# Patient Record
Sex: Male | Born: 1960
Health system: Southern US, Community
[De-identification: ages and names within clinical notes are randomized; demographics above are authoritative.]

## PROBLEM LIST (undated history)

## (undated) DIAGNOSIS — I10 Essential (primary) hypertension: Secondary | ICD-10-CM

## (undated) DIAGNOSIS — I2510 Atherosclerotic heart disease of native coronary artery without angina pectoris: Secondary | ICD-10-CM

## (undated) HISTORY — DX: Atherosclerotic heart disease of native coronary artery without angina pectoris: I25.10

## (undated) HISTORY — PX: CHOLECYSTECTOMY: SHX55

---

## 1998-07-25 ENCOUNTER — Encounter: Payer: Self-pay | Admitting: Endocrinology

## 1998-07-25 ENCOUNTER — Ambulatory Visit (HOSPITAL_COMMUNITY): Admission: RE | Admit: 1998-07-25 | Discharge: 1998-07-25 | Payer: Self-pay | Admitting: Endocrinology

## 2000-08-26 ENCOUNTER — Encounter: Payer: Self-pay | Admitting: Specialist

## 2000-08-26 ENCOUNTER — Ambulatory Visit (HOSPITAL_COMMUNITY): Admission: RE | Admit: 2000-08-26 | Discharge: 2000-08-26 | Payer: Self-pay | Admitting: Specialist

## 2004-04-25 ENCOUNTER — Ambulatory Visit: Payer: Self-pay | Admitting: Orthopedic Surgery

## 2004-05-02 ENCOUNTER — Ambulatory Visit (HOSPITAL_COMMUNITY): Admission: RE | Admit: 2004-05-02 | Discharge: 2004-05-02 | Payer: Self-pay | Admitting: Orthopedic Surgery

## 2004-05-10 ENCOUNTER — Ambulatory Visit: Payer: Self-pay | Admitting: Orthopedic Surgery

## 2007-05-19 ENCOUNTER — Emergency Department (HOSPITAL_COMMUNITY): Admission: EM | Admit: 2007-05-19 | Discharge: 2007-05-19 | Payer: Self-pay | Admitting: Emergency Medicine

## 2007-06-09 ENCOUNTER — Ambulatory Visit: Payer: Self-pay | Admitting: Internal Medicine

## 2007-06-15 ENCOUNTER — Encounter: Admission: RE | Admit: 2007-06-15 | Discharge: 2007-06-15 | Payer: Self-pay | Admitting: Internal Medicine

## 2007-10-16 ENCOUNTER — Encounter: Payer: Self-pay | Admitting: Internal Medicine

## 2008-06-17 ENCOUNTER — Encounter: Admission: RE | Admit: 2008-06-17 | Discharge: 2008-06-17 | Payer: Self-pay | Admitting: Neurosurgery

## 2010-07-03 ENCOUNTER — Ambulatory Visit (HOSPITAL_COMMUNITY)
Admission: RE | Admit: 2010-07-03 | Discharge: 2010-07-03 | Disposition: A | Discharge status: Home / Self Care | Payer: Federal, State, Local not specified - PPO | Source: Ambulatory Visit | Attending: General Surgery | Admitting: General Surgery

## 2010-07-03 DIAGNOSIS — Z1211 Encounter for screening for malignant neoplasm of colon: Secondary | ICD-10-CM | POA: Insufficient documentation

## 2010-07-03 DIAGNOSIS — I10 Essential (primary) hypertension: Secondary | ICD-10-CM | POA: Insufficient documentation

## 2010-07-03 NOTE — H&P (Signed)
  [name], [name] NO.:  [id number]  MEDICAL RECORD NO.:  [id number]            PATIENT TYPE:  LOCATION:                                 FACILITY:  PHYSICIAN:  Dalia Heading, M.D.  DATE OF BIRTH:  [birth year]  DATE OF ADMISSION: DATE OF DISCHARGE:  LH                             HISTORY & PHYSICAL   CHIEF COMPLAINT:  Need for screening colonoscopy.  HISTORY OF PRESENT ILLNESS:  The patient is a 50 year old white male who is referred for endoscopic evaluation.  He needs colonoscopy for screening purposes.  No abdominal pain, weight loss, nausea, vomiting, diarrhea, constipation, melena, hematochezia have been noted.  He has never had a colonoscopy.  PAST MEDICAL HISTORY:  Unremarkable.  PAST SURGICAL HISTORY:  Cholecystectomy.  CURRENT MEDICATIONS:  Fish oil and vitamins.  ALLERGIES:  No known drug allergies.  REVIEW OF SYSTEMS:  The patient denies drinking or smoking.  He denies any other cardiopulmonary difficulties or bleeding disorders.  FAMILY MEDICAL HISTORY:  The patient denies any family history of colon cancer.  On physical examination, the patient is a well-developed, well-nourished white male in no acute distress.  Lungs, clear to auscultation with equal breath sounds bilaterally.  Heart examination reveals a regular rate and rhythm without S3, S4, murmurs.  The abdomen is soft, nontender, nondistended.  No hepatosplenomegaly or masses are noted. Rectal examination was deferred during the procedure.  IMPRESSION:  Need for screening colonoscopy.  PLAN:  The patient is scheduled for a colonoscopy on July 03, 2010. The risks and benefits of the procedure including bleeding and perforation were fully explained to the patient, gave informed consent.     Dalia Heading, M.D.     MAJ/MEDQ  D:  06/26/2010  T:  06/27/2010  Job:  [id number]  cc:   Corrie Mckusick, M.D. Fax: [phone]  Short Stay at [facility]  Electronically Signed  by Franky Macho M.D. on 07/03/2010 08:49:45 AM

## 2010-07-05 NOTE — Op Note (Signed)
  NAMEJAMAIR, CATO NO.:  [id number]  MEDICAL RECORD NO.:  [id number]           PATIENT TYPE:  O  LOCATION:  [facility]                          FACILITY:  [facility]  PHYSICIAN:  Dalia Heading, M.D.  DATE OF BIRTH:  02-Mar-1961  DATE OF PROCEDURE:  07/03/2010 DATE OF DISCHARGE:                              OPERATIVE REPORT   PREOPERATIVE DIAGNOSIS:  Need for screening colonoscopy.  POSTOPERATIVE DIAGNOSIS:  Need for screening colonoscopy.  PROCEDURE:  Colonoscopy.  SURGEON:  Dalia Heading, MD  ANESTHESIA:  Demerol 50 mg IV, Versed 3 mg IV.  INDICATIONS:  The patient is a 50 year old white male who presents for a screening colonoscopy.  The risks and benefits of the procedure including bleeding and perforation were fully explained to the patient, gave informed consent.  PROCEDURE NOTE:  The patient was placed in left lateral decubitus position after placement of monitored anesthesia equipment.  Demerol and Versed were used throughout the procedure for anesthesia.  A rectal examination was performed which was negative.  The colonoscope was advanced to the cecum without difficulty. Confirmation of placement to the cecum was done using transabdominal palpation and landmarks.  The bowel preparation was excellent.  The cecum was fully visualized and noted to be within normal limits.  The ascending colon, transverse colon, descending colon, sigmoid colon, and rectum were all within normal limits.  No abnormal lesions were noted. The dentate line was inspected and noted to be within normal limits. All air was evacuated from the colon and rectum prior to removal of the colonoscope.  The patient tolerated the procedure well, was transferred back to Day Surgery in stable condition.  Complications none.  SPECIMEN:  None.  RECOMMENDATIONS:  A followup screening colonoscopy is recommended in 10 years.     Dalia Heading, M.D.     MAJ/MEDQ  D:  07/03/2010   T:  07/04/2010  Job:  [id number]  cc:   Corrie Mckusick, M.D. Fax: [phone]  Electronically Signed by Franky Macho M.D. on 07/05/2010 10:59:05 AM

## 2010-09-04 NOTE — Assessment & Plan Note (Signed)
[facility]                         GASTROENTEROLOGY OFFICE NOTE   NAME:[name], [name]                      MRN:          [id number]  DATE:06/09/2007                            DOB:          Oct 28, 1960    CHIEF COMPLAINT:  Abdominal pain.   HISTORY:  This is a 50 year old white man that has had intermittent  right upper quadrant pain and often a pressure symptom.  He will have  sort of a chronic pressure feeling there as well.  He had a severe spell  recently and he went to the [facility] ER.  It occurred after eating  pizza.  Normal LFTs while there.  He was told to see his primary care  physician and have an ultrasound.  He elected to come to me instead.  He  had a normal CBC as well.  This was on January 27th.  He does have what  he describes as some indigestion, not classic heartburn, but there are  some sporadic epigastric and right upper quadrant episodes of burning.  He has not really taken antacids or proton pump inhibitors at all.  Most  of the time he feels okay, though he frequently has this vague pressure  feeling in the right upper quadrant.  As far as caffeine use, he drinks  a lot of iced tea.  He is not a smoker.  He does not describe dysphagia,  and the remainder of his GI review of systems is negative.   CURRENT MEDICATIONS:  1. Effexor XR 150 mg daily.  2. Aspirin 81 mg daily.  3. Multivitamin daily.   PAST MEDICAL HISTORY:  1. History of seizure disorder.  He used to take dilantin, no longer.      He had heartburn and indigestion at that time and did use antacids.  2. Dyslipidemia.  3. History of depression.  4. Hypertension.   FAMILY HISTORY:  Father had heart disease, diabetes in a grandmother.   SOCIAL HISTORY:  He is separated.  He works for the post [employer].  One  son, one daughter.  He lives alone.  No alcohol, tobacco, or drugs.  He  has had some decrease in vision lately.  All other systems negative.   PHYSICAL  EXAMINATION:  A well-developed obese white man.  Height 5 feet 10 inches, weight 224.4 pounds, blood pressure is 128/86,  pulse 88.  EYES:  Anicteric.  ENT:  Normal mouth, posterior pharynx.  NECK:  Supple, no thyromegaly or mass.  CHEST:  Clear.  HEART:  S1, S2, no murmurs, rubs, or gallops.  ABDOMEN:  Shows a large diastasis recti, but is soft otherwise.  No  organomegaly or mass, nontender.  LYMPHATIC:  No neck or supraclavicular nodes.  EXTREMITIES:  No edema.  SKIN:  No rash.  PSYCH:  He is alert and oriented x3.  Appropriate affect.  NEURO:  Cranial nerves II-XII are intact.   ASSESSMENT:  Intermittent right upper quadrant pain and indigestion or  dyspepsia symptoms.  The differential seems to be possible biliary colic  versus gastroesophageal reflux disease.  Those are the 2 most likely  things.   PLAN:  1. Empiric trial of Prilosec OTC on a daily basis.  2. Abdominal ultrasound will be scheduled to see if he has gallstones.      If he does have gallstones, then a surgical evaluation would be      appropriate.  I told him that it would not necessarily prove that      his symptoms came from gallstones, however.  3. Since he does seem to have had some type of indigestion or      gastroesophageal reflux disease symptoms for about 5 years, an      upper endoscopy certainly could prove useful.  If he responds to      the Prilosec OTC, he should continue that, but I would still      recommend an upper gastrointestinal endoscopy to look for possible      esophageal damage, etc.     Iva Boop, MD,FACG  Electronically Signed    CEG/MedQ  DD: 06/09/2007  DT: 06/10/2007  Job #: [id number]   cc:   Angus G. Renard Matter, MD

## 2011-01-10 LAB — CBC
HCT: 43.2
Hemoglobin: 14.8
MCHC: 34.1
MCV: 84.4
Platelets: 353
RBC: 5.12
RDW: 13.8
WBC: 9.5

## 2011-01-10 LAB — COMPREHENSIVE METABOLIC PANEL
AST: 25
Albumin: 3.8
BUN: 12
CO2: 26
Calcium: 9
Chloride: 102
Creatinine, Ser: 0.87
GFR calc Af Amer: >60
GFR calc non Af Amer: >60
Total Bilirubin: 0.7

## 2011-01-10 LAB — LIPASE, BLOOD: Lipase: 26

## 2011-01-10 LAB — DIFFERENTIAL
Basophils Absolute: 0
Lymphocytes Relative: 13
Lymphs Abs: 1.2
Neutro Abs: 7.3

## 2016-01-25 DIAGNOSIS — R0989 Other specified symptoms and signs involving the circulatory and respiratory systems: Secondary | ICD-10-CM | POA: Diagnosis not present

## 2016-01-25 DIAGNOSIS — R0789 Other chest pain: Secondary | ICD-10-CM | POA: Diagnosis not present

## 2016-02-01 DIAGNOSIS — I10 Essential (primary) hypertension: Secondary | ICD-10-CM | POA: Diagnosis not present

## 2016-02-01 DIAGNOSIS — I6523 Occlusion and stenosis of bilateral carotid arteries: Secondary | ICD-10-CM | POA: Diagnosis not present

## 2016-02-01 DIAGNOSIS — R0789 Other chest pain: Secondary | ICD-10-CM | POA: Diagnosis not present

## 2016-03-20 DIAGNOSIS — R600 Localized edema: Secondary | ICD-10-CM | POA: Diagnosis not present

## 2016-03-20 DIAGNOSIS — J0141 Acute recurrent pansinusitis: Secondary | ICD-10-CM | POA: Diagnosis not present

## 2016-03-27 DIAGNOSIS — I10 Essential (primary) hypertension: Secondary | ICD-10-CM | POA: Diagnosis not present

## 2016-03-27 DIAGNOSIS — R635 Abnormal weight gain: Secondary | ICD-10-CM | POA: Diagnosis not present

## 2016-03-27 DIAGNOSIS — R252 Cramp and spasm: Secondary | ICD-10-CM | POA: Diagnosis not present

## 2016-06-21 DIAGNOSIS — E782 Mixed hyperlipidemia: Secondary | ICD-10-CM | POA: Diagnosis not present

## 2016-06-21 DIAGNOSIS — E6609 Other obesity due to excess calories: Secondary | ICD-10-CM | POA: Diagnosis not present

## 2016-06-21 DIAGNOSIS — I10 Essential (primary) hypertension: Secondary | ICD-10-CM | POA: Diagnosis not present

## 2016-06-21 DIAGNOSIS — Z1389 Encounter for screening for other disorder: Secondary | ICD-10-CM | POA: Diagnosis not present

## 2016-06-21 DIAGNOSIS — Z6836 Body mass index (BMI) 36.0-36.9, adult: Secondary | ICD-10-CM | POA: Diagnosis not present

## 2016-06-26 DIAGNOSIS — I10 Essential (primary) hypertension: Secondary | ICD-10-CM | POA: Diagnosis not present

## 2017-01-22 DIAGNOSIS — Z23 Encounter for immunization: Secondary | ICD-10-CM | POA: Diagnosis not present

## 2017-01-22 DIAGNOSIS — Z79899 Other long term (current) drug therapy: Secondary | ICD-10-CM | POA: Diagnosis not present

## 2017-01-22 DIAGNOSIS — I10 Essential (primary) hypertension: Secondary | ICD-10-CM | POA: Diagnosis not present

## 2017-01-22 DIAGNOSIS — R9431 Abnormal electrocardiogram [ECG] [EKG]: Secondary | ICD-10-CM | POA: Diagnosis not present

## 2017-01-22 DIAGNOSIS — Z125 Encounter for screening for malignant neoplasm of prostate: Secondary | ICD-10-CM | POA: Diagnosis not present

## 2017-04-10 DIAGNOSIS — M545 Low back pain: Secondary | ICD-10-CM | POA: Diagnosis not present

## 2017-04-10 DIAGNOSIS — M5410 Radiculopathy, site unspecified: Secondary | ICD-10-CM | POA: Diagnosis not present

## 2017-04-10 DIAGNOSIS — E6609 Other obesity due to excess calories: Secondary | ICD-10-CM | POA: Diagnosis not present

## 2017-04-10 DIAGNOSIS — Z1389 Encounter for screening for other disorder: Secondary | ICD-10-CM | POA: Diagnosis not present

## 2017-04-10 DIAGNOSIS — Z6835 Body mass index (BMI) 35.0-35.9, adult: Secondary | ICD-10-CM | POA: Diagnosis not present

## 2017-04-17 DIAGNOSIS — M7751 Other enthesopathy of right foot: Secondary | ICD-10-CM | POA: Diagnosis not present

## 2017-04-17 DIAGNOSIS — M7752 Other enthesopathy of left foot: Secondary | ICD-10-CM | POA: Diagnosis not present

## 2017-04-17 DIAGNOSIS — G5761 Lesion of plantar nerve, right lower limb: Secondary | ICD-10-CM | POA: Diagnosis not present

## 2017-04-17 DIAGNOSIS — G5762 Lesion of plantar nerve, left lower limb: Secondary | ICD-10-CM | POA: Diagnosis not present

## 2017-04-24 DIAGNOSIS — M7751 Other enthesopathy of right foot: Secondary | ICD-10-CM | POA: Diagnosis not present

## 2017-04-24 DIAGNOSIS — G5760 Lesion of plantar nerve, unspecified lower limb: Secondary | ICD-10-CM | POA: Diagnosis not present

## 2017-04-24 DIAGNOSIS — M7752 Other enthesopathy of left foot: Secondary | ICD-10-CM | POA: Diagnosis not present

## 2017-06-23 DIAGNOSIS — E6609 Other obesity due to excess calories: Secondary | ICD-10-CM | POA: Diagnosis not present

## 2017-06-23 DIAGNOSIS — R42 Dizziness and giddiness: Secondary | ICD-10-CM | POA: Diagnosis not present

## 2017-06-23 DIAGNOSIS — Z6835 Body mass index (BMI) 35.0-35.9, adult: Secondary | ICD-10-CM | POA: Diagnosis not present

## 2017-06-23 DIAGNOSIS — Z1389 Encounter for screening for other disorder: Secondary | ICD-10-CM | POA: Diagnosis not present

## 2017-09-02 DIAGNOSIS — G5762 Lesion of plantar nerve, left lower limb: Secondary | ICD-10-CM | POA: Diagnosis not present

## 2017-09-02 DIAGNOSIS — G5761 Lesion of plantar nerve, right lower limb: Secondary | ICD-10-CM | POA: Diagnosis not present

## 2017-10-17 DIAGNOSIS — R5383 Other fatigue: Secondary | ICD-10-CM | POA: Diagnosis not present

## 2017-10-17 DIAGNOSIS — F320 Major depressive disorder, single episode, mild: Secondary | ICD-10-CM | POA: Diagnosis not present

## 2017-10-17 DIAGNOSIS — Z6835 Body mass index (BMI) 35.0-35.9, adult: Secondary | ICD-10-CM | POA: Diagnosis not present

## 2017-10-17 DIAGNOSIS — Z1389 Encounter for screening for other disorder: Secondary | ICD-10-CM | POA: Diagnosis not present

## 2017-12-31 ENCOUNTER — Emergency Department (HOSPITAL_COMMUNITY)
Admission: EM | Admit: 2017-12-31 | Discharge: 2017-12-31 | Disposition: A | Discharge status: Home / Self Care | Payer: Federal, State, Local not specified - PPO | Attending: Emergency Medicine | Admitting: Emergency Medicine

## 2017-12-31 ENCOUNTER — Encounter (HOSPITAL_COMMUNITY): Payer: Self-pay | Admitting: Emergency Medicine

## 2017-12-31 ENCOUNTER — Other Ambulatory Visit: Payer: Self-pay

## 2017-12-31 DIAGNOSIS — R197 Diarrhea, unspecified: Secondary | ICD-10-CM | POA: Diagnosis not present

## 2017-12-31 DIAGNOSIS — I10 Essential (primary) hypertension: Secondary | ICD-10-CM | POA: Insufficient documentation

## 2017-12-31 DIAGNOSIS — Z79899 Other long term (current) drug therapy: Secondary | ICD-10-CM | POA: Diagnosis not present

## 2017-12-31 HISTORY — DX: Essential (primary) hypertension: I10

## 2017-12-31 LAB — URINALYSIS, ROUTINE W REFLEX MICROSCOPIC
Bacteria, UA: NONE SEEN
Bilirubin Urine: NEGATIVE
GLUCOSE, UA: NEGATIVE mg/dL
Ketones, ur: 5 mg/dL — AB
Leukocytes, UA: NEGATIVE
NITRITE: NEGATIVE
PH: 5 (ref 5.0–8.0)
Protein, ur: 30 mg/dL — AB
SPECIFIC GRAVITY, URINE: 1.016 (ref 1.005–1.030)

## 2017-12-31 LAB — BASIC METABOLIC PANEL
Anion gap: 9 (ref 5–15)
BUN: 8 mg/dL (ref 6–20)
CHLORIDE: 100 mmol/L (ref 98–111)
CO2: 26 mmol/L (ref 22–32)
Calcium: 8.6 mg/dL — ABNORMAL LOW (ref 8.9–10.3)
Creatinine, Ser: 1.35 mg/dL — ABNORMAL HIGH (ref 0.61–1.24)
GFR calc Af Amer: >60 mL/min (ref >60)
GFR calc non Af Amer: 57 mL/min — ABNORMAL LOW (ref >60)
GLUCOSE: 102 mg/dL — AB (ref 70–99)
POTASSIUM: 3.1 mmol/L — AB (ref 3.5–5.1)
Sodium: 135 mmol/L (ref 135–145)

## 2017-12-31 LAB — CBC WITH DIFFERENTIAL/PLATELET
Basophils Absolute: 0 10*3/uL (ref 0.0–0.1)
Basophils Relative: 0 %
Eosinophils Absolute: 0 10*3/uL (ref 0.0–0.7)
Eosinophils Relative: 0 %
HEMATOCRIT: 47.2 % (ref 39.0–52.0)
Hemoglobin: 16.4 g/dL (ref 13.0–17.0)
Lymphocytes Relative: 12 %
Lymphs Abs: 1.7 10*3/uL (ref 0.7–4.0)
MCH: 29.2 pg (ref 26.0–34.0)
MCHC: 34.7 g/dL (ref 30.0–36.0)
MCV: 84 fL (ref 78.0–100.0)
MONO ABS: 1.2 10*3/uL — AB (ref 0.1–1.0)
Monocytes Relative: 8 %
NEUTROS ABS: 11.4 10*3/uL — AB (ref 1.7–7.7)
Neutrophils Relative %: 80 %
Platelets: 222 10*3/uL (ref 150–400)
RBC: 5.62 MIL/uL (ref 4.22–5.81)
RDW: 14 % (ref 11.5–15.5)
WBC: 14.2 10*3/uL — ABNORMAL HIGH (ref 4.0–10.5)

## 2017-12-31 NOTE — ED Provider Notes (Signed)
[facility] EMERGENCY DEPARTMENT Provider Note   CSN: [id number] Arrival date & time: 12/31/17  1535     History   Chief Complaint Chief Complaint  Patient presents with  . Abdominal Pain    HPI [name] is a 57 y.o. male.  Patient reports diarrhea for the past 24 hours with some abdominal bloating.  He thinks he was exposed to a virus.  His secondary concern is exposure to a dead skunk and the possibility of rabies exposure.  He picked up the skunk with a pair of gloves and deposited in a plastic bag.  At no time did his skin touch the skunk.  Review of systems positive for low-grade fever and chills.  Severity of symptoms is mild.  Nothing makes symptoms better or worse.     Past Medical History:  Diagnosis Date  . Hypertension     There are no active problems to display for this patient.   Past Surgical History:  Procedure Laterality Date  . CHOLECYSTECTOMY          Home Medications    Prior to Admission medications   Medication Sig Start Date End Date Taking? Authorizing Provider  buPROPion (WELLBUTRIN XL) 150 MG 24 hr tablet Take 150 mg by mouth daily. 10/17/17  Yes [provider]  CARTIA XT 180 MG 24 hr capsule Take 180 mg by mouth daily. 12/13/17  Yes [provider]  ibuprofen (ADVIL,MOTRIN) 200 MG tablet Take 400-600 mg by mouth daily as needed for mild pain or moderate pain.   Yes [provider]  losartan-hydrochlorothiazide (HYZAAR) 100-12.5 MG tablet Take 1 tablet by mouth daily.   Yes [provider]    Family History History reviewed. No pertinent family history.  Social History Social History   Tobacco Use  . Smoking status: Never Smoker  . Smokeless tobacco: Never Used  Substance Use Topics  . Alcohol use: Never    Frequency: Never  . Drug use: Never     Allergies   Patient has no known allergies.   Review of Systems Review of Systems  All other systems reviewed and are  negative.    Physical Exam Updated Vital Signs BP 131/83 (BP Location: Right Arm)   Pulse 92   Temp 99.5 F (37.5 C) (Oral)   Resp 18   Ht 5\' 10"  (1.778 m)   Wt 111.1 kg   SpO2 96%   BMI 35.15 kg/m   Physical Exam  Constitutional: He is oriented to person, place, and time. He appears well-developed and well-nourished.  HENT:  Head: Normocephalic and atraumatic.  Eyes: Conjunctivae are normal.  Neck: Neck supple.  Cardiovascular: Normal rate and regular rhythm.  Pulmonary/Chest: Effort normal and breath sounds normal.  Abdominal: Soft. Bowel sounds are normal.  Musculoskeletal: Normal range of motion.  Neurological: He is alert and oriented to person, place, and time.  Skin: Skin is warm and dry.  Psychiatric: He has a normal mood and affect. His behavior is normal.  Nursing note and vitals reviewed.    ED Treatments / Results  Labs (all labs ordered are listed, but only abnormal results are displayed) Labs Reviewed  URINALYSIS, ROUTINE W REFLEX MICROSCOPIC - Abnormal; Notable for the following components:      Result Value   Hgb urine dipstick SMALL (*)    Ketones, ur 5 (*)    Protein, ur 30 (*)    All other components within normal limits  CBC WITH DIFFERENTIAL/PLATELET - Abnormal; Notable  for the following components:   WBC 14.2 (*)    Neutro Abs 11.4 (*)    Monocytes Absolute 1.2 (*)    All other components within normal limits  BASIC METABOLIC PANEL    EKG None  Radiology No results found.  Procedures Procedures (including critical care time)  Medications Ordered in ED Medications - No data to display   Initial Impression / Assessment and Plan / ED Course  I have reviewed the triage vital signs and the nursing notes.  Pertinent labs & imaging results that were available during my care of the patient were reviewed by me and considered in my medical decision making (see chart for details).     Patient presents with diarrhea and concern over a  skunk exposure.  Ketones were noted in the urine.  My opinion is that he is low risk for rabies.  He was satisfied with that answer.  He did not want any further testing or IV fluids today.  Final Clinical Impressions(s) / ED Diagnoses   Final diagnoses:  Diarrhea, unspecified type    ED Discharge Orders    None       Donnetta Hutching, MD 12/31/17 2002

## 2017-12-31 NOTE — ED Triage Notes (Signed)
Pt reports abd pain, diarrhea x5,chills since last night.  Pt reports had dead skunk in yard. Pt denies any contact but reports want to make sure "wasn't exposed to rabies."

## 2017-12-31 NOTE — Discharge Instructions (Addendum)
Increase fluids.  You are ultra low risk for rabies exposure.

## 2018-01-02 ENCOUNTER — Other Ambulatory Visit: Payer: Self-pay

## 2018-01-02 ENCOUNTER — Encounter (HOSPITAL_COMMUNITY): Payer: Self-pay | Admitting: *Deleted

## 2018-01-02 ENCOUNTER — Emergency Department (HOSPITAL_COMMUNITY)
Admission: EM | Admit: 2018-01-02 | Discharge: 2018-01-02 | Disposition: A | Discharge status: Home / Self Care | Payer: Federal, State, Local not specified - PPO | Attending: Emergency Medicine | Admitting: Emergency Medicine

## 2018-01-02 DIAGNOSIS — R197 Diarrhea, unspecified: Secondary | ICD-10-CM | POA: Insufficient documentation

## 2018-01-02 DIAGNOSIS — I10 Essential (primary) hypertension: Secondary | ICD-10-CM | POA: Insufficient documentation

## 2018-01-02 DIAGNOSIS — Z79899 Other long term (current) drug therapy: Secondary | ICD-10-CM | POA: Insufficient documentation

## 2018-01-02 MED ORDER — LOPERAMIDE HCL 2 MG PO CAPS: 2.0000 mg | ORAL_CAPSULE | Freq: Four times a day (QID) | ORAL | 0 refills | Status: DC | PRN

## 2018-01-02 NOTE — Discharge Instructions (Signed)
The symptoms of rabies do not show up until someone has had the bite for over 2-8 weeks - As there was no bite and this started less than a week after the skunk exposure - it does not fit a pattern for rabies.  You may take imodium for diarrhea as needed  See your doctor in 2 days for recheck

## 2018-01-02 NOTE — ED Provider Notes (Signed)
[facility] EMERGENCY DEPARTMENT Provider Note   CSN: [id number] Arrival date & time: 01/02/18  2028     History   Chief Complaint Chief Complaint  Patient presents with  . Follow-up    HPI Gene Kansashomas D Ross is a 57 y.o. male.  HPI  The patient is a 57 year old male, history of high blood pressure, otherwise healthy individual, presents concerned that he may have rabies.  The patient states that he handled a dead skunk approximately 11 days ago, shortly after handling the skunk (with gloves, only touching the tail) he started to have some abdominal discomfort, some watery diarrhea, some temperatures between 99 and 100 degrees and now has a scratchy throat.  He is concerned that this may be related to a rabies exposure.  Again the patient never had any exposure to saliva or any other injury to his skin.  And again his symptoms started less than 1 week after he handled the skunk.  No other travel, no recent exposures to other sick people or food exposures.  Past Medical History:  Diagnosis Date  . Hypertension     There are no active problems to display for this patient.   Past Surgical History:  Procedure Laterality Date  . CHOLECYSTECTOMY          Home Medications    Prior to Admission medications   Medication Sig Start Date End Date Taking? Authorizing Provider  buPROPion (WELLBUTRIN XL) 150 MG 24 hr tablet Take 150 mg by mouth daily. 10/17/17   [provider]  CARTIA XT 180 MG 24 hr capsule Take 180 mg by mouth daily. 12/13/17   [provider]  ibuprofen (ADVIL,MOTRIN) 200 MG tablet Take 400-600 mg by mouth daily as needed for mild pain or moderate pain.    [provider]  loperamide (IMODIUM) 2 MG capsule Take 1 capsule (2 mg total) by mouth 4 (four) times daily as needed for diarrhea or loose stools. 01/02/18   Eber HongMiller, Sandria Mcenroe, MD  losartan-hydrochlorothiazide (HYZAAR) 100-12.5 MG tablet Take 1 tablet by mouth daily.    [provider]    Family History No family history on file.  Social History Social History   Tobacco Use  . Smoking status: Never Smoker  . Smokeless tobacco: Never Used  Substance Use Topics  . Alcohol use: Never    Frequency: Never  . Drug use: Never     Allergies   Patient has no known allergies.   Review of Systems Review of Systems  All other systems reviewed and are negative.    Physical Exam Updated Vital Signs BP (!) 149/90 (BP Location: Right Arm)   Pulse 92   Temp 98.8 F (37.1 C) (Oral)   Resp 16   Ht 1.778 m (5\' 10" )   Wt 107 kg   SpO2 95%   BMI 33.83 kg/m   Physical Exam  Constitutional: He appears well-developed and well-nourished. No distress.  HENT:  Head: Normocephalic and atraumatic.  Mouth/Throat: Oropharynx is clear and moist. No oropharyngeal exudate.  Mildly erythematous posterior pharynx without exudate asymmetry or hypertrophy, uvula midline, phonation normal  Eyes: Pupils are equal, round, and reactive to light. Conjunctivae and EOM are normal. Right eye exhibits no discharge. Left eye exhibits no discharge. No scleral icterus.  Neck: Normal range of motion. Neck supple. No JVD present. No thyromegaly present.  No lymphadenopathy of the neck  Cardiovascular: Normal rate, regular rhythm, normal heart sounds and intact distal pulses. Exam reveals no gallop and  no friction rub.  No murmur heard. Pulmonary/Chest: Effort normal and breath sounds normal. No respiratory distress. He has no wheezes. He has no rales.  Abdominal: Soft. Bowel sounds are normal. He exhibits no distension and no mass. There is no tenderness.  Soft and totally nontender abdomen  Musculoskeletal: Normal range of motion. He exhibits no edema or tenderness.  Lymphadenopathy:    He has no cervical adenopathy.  Neurological: He is alert. Coordination normal.  Skin: Skin is warm and dry. No rash noted. No erythema.  No rashes or injuries were puncture wounds  Psychiatric: He has  a normal mood and affect. His behavior is normal.  Nursing note and vitals reviewed.    ED Treatments / Results  Labs (all labs ordered are listed, but only abnormal results are displayed) Labs Reviewed - No data to display  EKG None  Radiology No results found.  Procedures Procedures (including critical care time)  Medications Ordered in ED Medications - No data to display   Initial Impression / Assessment and Plan / ED Course  I have reviewed the triage vital signs and the nursing notes.  Pertinent labs & imaging results that were available during my care of the patient were reviewed by me and considered in my medical decision making (see chart for details).    The patient is clearly nervous about this exposure thinking that all of the symptoms come together to make a diagnosis of rabies however his symptoms started less than 1 week after the exposure, there is no exposure to saliva, he was wearing fully gloved hands when he touch this cocktail, the incubation period is longer than 2 weeks and up to 8 weeks and most exposures making this and very very unlikely diagnosis and I would not recommend rabies vaccination at this time, I agree with Dr. Adriana Simas from his prior visit and will forego treatment at this time.  The patient was given reassurance and encouraged to use Imodium as needed  .  He expressed his understanding.  Final Clinical Impressions(s) / ED Diagnoses   Final diagnoses:  Diarrhea, unspecified type    ED Discharge Orders         Ordered    loperamide (IMODIUM) 2 MG capsule  4 times daily PRN     01/02/18 2054           Eber Hong, MD 01/02/18 [phone]

## 2018-01-02 NOTE — ED Triage Notes (Signed)
Pt states that two weeks ago he found a dead skunk in his backyard, pt denies any scratches, bites by skunk, states that he handled the dead skunk with hands that was gloved, pt started having abd pain, fever, diarrhea, fever of 99-100 a week ago, was seen in er two days ago for same symptoms, pt states that he still believes that he may have been exposed to rabies.

## 2018-01-13 DIAGNOSIS — Z1389 Encounter for screening for other disorder: Secondary | ICD-10-CM | POA: Diagnosis not present

## 2018-01-13 DIAGNOSIS — E6609 Other obesity due to excess calories: Secondary | ICD-10-CM | POA: Diagnosis not present

## 2018-01-13 DIAGNOSIS — J069 Acute upper respiratory infection, unspecified: Secondary | ICD-10-CM | POA: Diagnosis not present

## 2018-01-13 DIAGNOSIS — Z6834 Body mass index (BMI) 34.0-34.9, adult: Secondary | ICD-10-CM | POA: Diagnosis not present

## 2018-07-22 DIAGNOSIS — J069 Acute upper respiratory infection, unspecified: Secondary | ICD-10-CM | POA: Diagnosis not present

## 2018-07-22 DIAGNOSIS — Z6835 Body mass index (BMI) 35.0-35.9, adult: Secondary | ICD-10-CM | POA: Diagnosis not present

## 2018-07-22 DIAGNOSIS — Z1389 Encounter for screening for other disorder: Secondary | ICD-10-CM | POA: Diagnosis not present

## 2018-07-22 DIAGNOSIS — E6609 Other obesity due to excess calories: Secondary | ICD-10-CM | POA: Diagnosis not present

## 2018-12-31 DIAGNOSIS — G4730 Sleep apnea, unspecified: Secondary | ICD-10-CM | POA: Diagnosis not present

## 2018-12-31 DIAGNOSIS — E7849 Other hyperlipidemia: Secondary | ICD-10-CM | POA: Diagnosis not present

## 2018-12-31 DIAGNOSIS — I10 Essential (primary) hypertension: Secondary | ICD-10-CM | POA: Diagnosis not present

## 2018-12-31 DIAGNOSIS — Z23 Encounter for immunization: Secondary | ICD-10-CM | POA: Diagnosis not present

## 2018-12-31 DIAGNOSIS — Z6834 Body mass index (BMI) 34.0-34.9, adult: Secondary | ICD-10-CM | POA: Diagnosis not present

## 2019-03-09 DIAGNOSIS — Z20828 Contact with and (suspected) exposure to other viral communicable diseases: Secondary | ICD-10-CM | POA: Diagnosis not present

## 2019-03-09 DIAGNOSIS — R0789 Other chest pain: Secondary | ICD-10-CM | POA: Diagnosis not present

## 2019-03-09 DIAGNOSIS — Z23 Encounter for immunization: Secondary | ICD-10-CM | POA: Diagnosis not present

## 2019-03-09 DIAGNOSIS — E6609 Other obesity due to excess calories: Secondary | ICD-10-CM | POA: Diagnosis not present

## 2019-03-09 DIAGNOSIS — Z6833 Body mass index (BMI) 33.0-33.9, adult: Secondary | ICD-10-CM | POA: Diagnosis not present

## 2019-03-10 ENCOUNTER — Other Ambulatory Visit: Payer: Self-pay

## 2019-03-10 DIAGNOSIS — Z20822 Contact with and (suspected) exposure to covid-19: Secondary | ICD-10-CM

## 2019-03-12 ENCOUNTER — Telehealth: Payer: Self-pay | Admitting: *Deleted

## 2019-03-12 LAB — NOVEL CORONAVIRUS, NAA: SARS-CoV-2, NAA: NOT DETECTED

## 2019-03-12 NOTE — Telephone Encounter (Signed)
Patient called ,was advised to call back due to results not being resulted .

## 2019-05-14 ENCOUNTER — Encounter: Payer: Self-pay | Admitting: Cardiology

## 2019-05-14 ENCOUNTER — Ambulatory Visit: Payer: Federal, State, Local not specified - PPO | Admitting: Cardiology

## 2019-05-14 VITALS — BP 155/102 | HR 74 | Temp 97.3°F | Ht 70.0 in | Wt 235.0 lb

## 2019-05-14 DIAGNOSIS — R0789 Other chest pain: Secondary | ICD-10-CM

## 2019-05-14 NOTE — Progress Notes (Signed)
Clinical Summary Mr. Breeze is a 59 y.o.male seen as new connsult, referred by Dr Hilma Favors for chest pain  1. Chest pain - isolated episode a few weeks - whlie at rest sharp pain. 3-4/10 in severity midchest. No other associated symptoms. Lasted 20 seconds, resolved on it own. Not positional - no recurrent symptoms - walks at work regularly, no exertioanl symptoms.  CAD risk factors: HTN, father CABG in 58s.    - reports prior stress test in 2017.   2. 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  Past Medical History:  Diagnosis Date  . Hypertension      No Known Allergies   Current Outpatient Medications  Medication Sig Dispense Refill  . buPROPion (WELLBUTRIN XL) 150 MG 24 hr tablet Take 150 mg by mouth daily.  0  . CARTIA XT 180 MG 24 hr capsule Take 180 mg by mouth daily.  5  . ibuprofen (ADVIL,MOTRIN) 200 MG tablet Take 400-600 mg by mouth daily as needed for mild pain or moderate pain.    [name] Kitchen loperamide (IMODIUM) 2 MG capsule Take 1 capsule (2 mg total) by mouth 4 (four) times daily as needed for diarrhea or loose stools. 12 capsule 0  . losartan-hydrochlorothiazide (HYZAAR) 100-12.5 MG tablet Take 1 tablet by mouth daily.     No current facility-administered medications for this visit.     Past Surgical History:  Procedure Laterality Date  . CHOLECYSTECTOMY       No Known Allergies    No family history on file.   Social History Mr. Earnhart reports that he has never smoked. He has never used smokeless tobacco. Mr. Eslick reports no history of alcohol use.   Review of Systems CONSTITUTIONAL: No weight loss, fever, chills, weakness or fatigue.  HEENT: Eyes: No visual loss, blurred vision, double vision or yellow sclerae.No hearing loss, sneezing, congestion, runny nose or sore throat.  SKIN: No rash or itching.  CARDIOVASCULAR: per hpi RESPIRATORY: No shortness of breath, cough or sputum.    GASTROINTESTINAL: No anorexia, nausea, vomiting or diarrhea. No abdominal pain or blood.  GENITOURINARY: No burning on urination, no polyuria NEUROLOGICAL: No headache, dizziness, syncope, paralysis, ataxia, numbness or tingling in the extremities. No change in bowel or bladder control.  MUSCULOSKELETAL: No muscle, back pain, joint pain or stiffness.  LYMPHATICS: No enlarged nodes. No history of splenectomy.  PSYCHIATRIC: No history of depression or anxiety.  ENDOCRINOLOGIC: No reports of sweating, cold or heat intolerance. No polyuria or polydipsia.  [name] Kitchen   Physical Examination Today's Vitals   05/14/19 1039  BP: (!) 155/102  Pulse: 74  Temp: (!) 97.3 F (36.3 C)  TempSrc: Temporal  SpO2: 97%  Weight: 235 lb (106.6 kg)  Height: 5\' 10"  (1.778 m)   Body mass index is 33.72 kg/m.  Gen: resting comfortably, no acute distress HEENT: no scleral icterus, pupils equal round and reactive, no palptable cervical adenopathy,  CV: RRR, no m/r/g, no jvd Resp: Clear to auscultation bilaterally GI: abdomen is soft, non-tender, non-distended, normal bowel sounds, no hepatosplenomegaly MSK: extremities are warm, no edema.  Skin: warm, no rash Neuro:  no focal deficits Psych: appropriate affect     Assessment and Plan  1. Chest pain - isolated episode of atypical symptoms, no recurrence. - continue to monitor at this time, no plans for ischemic testing at this time.   2. HTN  -elevated today, from recent pcp appt  was at goal. - continue to monitor at this time   F/u 1 year  Antoine Poche, M.D.

## 2019-05-14 NOTE — Patient Instructions (Signed)

## 2019-06-25 ENCOUNTER — Ambulatory Visit: Payer: Federal, State, Local not specified - PPO

## 2019-06-26 ENCOUNTER — Ambulatory Visit: Payer: Federal, State, Local not specified - PPO | Attending: Internal Medicine

## 2019-06-26 ENCOUNTER — Other Ambulatory Visit: Payer: Self-pay

## 2019-06-26 DIAGNOSIS — Z23 Encounter for immunization: Secondary | ICD-10-CM | POA: Insufficient documentation

## 2019-06-26 NOTE — Progress Notes (Signed)
   Covid-19 Vaccination Clinic  Name:  [name]    MRN: [id number] DOB: 1961/01/13  06/26/2019  Mr. [name] was observed post Covid-19 immunization for 15 minutes without incident. He was provided with Vaccine Information Sheet and instruction to access the V-Safe system.   Mr. [name] was instructed to call 911 with any severe reactions post vaccine: [name] Kitchen Difficulty breathing  . Swelling of face and throat  . A fast heartbeat  . A bad rash all over body  . Dizziness and weakness   Immunizations Administered    Name Date Dose VIS Date Route   Moderna COVID-19 Vaccine 06/26/2019  8:32 AM 0.5 mL 03/23/2019 Intramuscular   Manufacturer: Moderna   Lot: [id number]   NDC: 89784-784-12

## 2019-06-30 ENCOUNTER — Ambulatory Visit (INDEPENDENT_AMBULATORY_CARE_PROVIDER_SITE_OTHER): Payer: Federal, State, Local not specified - PPO

## 2019-06-30 ENCOUNTER — Other Ambulatory Visit: Payer: Self-pay

## 2019-06-30 ENCOUNTER — Ambulatory Visit
Admission: EM | Admit: 2019-06-30 | Discharge: 2019-06-30 | Disposition: A | Discharge status: Home / Self Care | Payer: Federal, State, Local not specified - PPO | Attending: Emergency Medicine | Admitting: Emergency Medicine

## 2019-06-30 DIAGNOSIS — S93491A Sprain of other ligament of right ankle, initial encounter: Secondary | ICD-10-CM

## 2019-06-30 DIAGNOSIS — M25571 Pain in right ankle and joints of right foot: Secondary | ICD-10-CM | POA: Diagnosis not present

## 2019-06-30 DIAGNOSIS — S99911A Unspecified injury of right ankle, initial encounter: Secondary | ICD-10-CM

## 2019-06-30 NOTE — ED Provider Notes (Signed)
[facility]   [id number] 06/30/19 Arrival Time: 9211  CC: Rt ankle PAIN  SUBJECTIVE: History from: patient. [name] is a 59 y.o. male complains of right ankle pain/ injury that occurred this morning.  Symptoms began after he inverted his ankle after stepping off a curb.  Localizes the pain to the outside and front of ankle.  Describes the pain as intermittent and achy in character.  Has not tried OTC medications. Symptoms are made worse with weight-bearing and walking.  Denies similar symptoms in the past.  Complains of associated swelling and bruising.  Denies fever, chills, erythema, weakness, numbness and tingling.  ROS: As per HPI.  All other pertinent ROS negative.     Past Medical History:  Diagnosis Date  . Hypertension    Past Surgical History:  Procedure Laterality Date  . CHOLECYSTECTOMY     No Known Allergies No current facility-administered medications on file prior to encounter.   Current Outpatient Medications on File Prior to Encounter  Medication Sig Dispense Refill  . buPROPion (WELLBUTRIN XL) 300 MG 24 hr tablet Take 300 mg by mouth daily.    [name] Kitchen ibuprofen (ADVIL,MOTRIN) 200 MG tablet Take 400-600 mg by mouth daily as needed for mild pain or moderate pain.    [name] Kitchen losartan (COZAAR) 100 MG tablet Take 100 mg by mouth daily.     Social History   Socioeconomic History  . Marital status: Single    Spouse name: Not on file  . Number of children: Not on file  . Years of education: Not on file  . Highest education level: Not on file  Occupational History  . Not on file  Tobacco Use  . Smoking status: Never Smoker  . Smokeless tobacco: Never Used  Substance and Sexual Activity  . Alcohol use: Never  . Drug use: Never  . Sexual activity: Not on file  Other Topics Concern  . Not on file  Social History Narrative  . Not on file   Social Determinants of Health   Financial Resource Strain:   . Difficulty of Paying Living Expenses: Not on file    Food Insecurity:   . Worried About Charity fundraiser in the Last Year: Not on file  . Ran Out of Food in the Last Year: Not on file  Transportation Needs:   . Lack of Transportation (Medical): Not on file  . Lack of Transportation (Non-Medical): Not on file  Physical Activity:   . Days of Exercise per Week: Not on file  . Minutes of Exercise per Session: Not on file  Stress:   . Feeling of Stress : Not on file  Social Connections:   . Frequency of Communication with Friends and Family: Not on file  . Frequency of Social Gatherings with Friends and Family: Not on file  . Attends Religious Services: Not on file  . Active Member of Clubs or Organizations: Not on file  . Attends Archivist Meetings: Not on file  . Marital Status: Not on file  Intimate Partner Violence:   . Fear of Current or Ex-Partner: Not on file  . Emotionally Abused: Not on file  . Physically Abused: Not on file  . Sexually Abused: Not on file   Family History  Problem Relation Age of Onset  . Healthy Mother   . Diabetes Father   . Hypotension Father     OBJECTIVE:  Vitals:   06/30/19 1722  BP: (!) 171/100  Pulse: 83  Resp:  16  Temp: 97.9 F (36.6 C)  TempSrc: Oral  SpO2: 95%    General appearance: ALERT; in no acute distress.  Head: NCAT Lungs: Normal respiratory effort CV: Dorsalis pedis pulses 2+ . Cap refill < 2 seconds Musculoskeletal: RT ankle Inspection: Swelling over lateral ankle Palpation: TTP over lateral ankle ROM: FROM active and passive Strength: 5/5 dorsiflexion, 5/5 plantar flexion Skin: warm and dry Neurologic: Ambulates with antalgic gait; Sensation intact about the lower extremities Psychological: alert and cooperative; normal mood and affect  DIAGNOSTIC STUDIES:  DG Ankle Complete Right  Result Date: 06/30/2019 CLINICAL DATA:  Twisting injury today with right ankle pain, initial encounter EXAM: RIGHT ANKLE - COMPLETE 3+ VIEW COMPARISON:  None. FINDINGS:  Mild soft tissue swelling is noted about the ankle joint. No acute fracture or dislocation is noted. Mild tarsal degenerative changes are seen. IMPRESSION: Soft tissue swelling without definitive bony abnormality. Electronically Signed   By: Alcide Clever M.D.   On: 06/30/2019 17:51    X-rays negative for bony abnormalities including fracture, or dislocation.   I have reviewed the x-rays myself and the radiologist interpretation. I am in agreement with the radiologist interpretation.     ASSESSMENT & PLAN:  1. Acute right ankle pain   2. Injury of right ankle, initial encounter   3. Sprain of anterior talofibular ligament of right ankle, initial encounter    X-ray negative for fracture or dislocation Cam walker applied Continue conservative management of rest, ice, and gentle stretches Alternate ibuprofen and/or tylenol as needed for pain and inflammation Follow up with PCP if symptoms persist Return or go to the ER if you have any new or worsening symptoms (fever, chills, swelling, bruising, redness, worsening symptoms despite medication, etc...)   Reviewed expectations re: course of current medical issues. Questions answered. Outlined signs and symptoms indicating need for more acute intervention. Patient verbalized understanding. After Visit Summary given.    Rennis Harding, PA-C 06/30/19 1805

## 2019-06-30 NOTE — Discharge Instructions (Addendum)
X-ray negative for fracture or dislocation Cam walker applied Continue conservative management of rest, ice, and gentle stretches Alternate ibuprofen and/or tylenol as needed for pain and inflammation Follow up with PCP if symptoms persist Return or go to the ER if you have any new or worsening symptoms (fever, chills, swelling, bruising, redness, worsening symptoms despite medication, etc...)

## 2019-06-30 NOTE — ED Triage Notes (Signed)
Pt presents to UC w/ c/o right ankle injury this morning. Pt states he stepped off of a curb and twisted his right ankle. Pt is limping to room. Rt ankle slightly swollen and bruised.

## 2019-07-28 ENCOUNTER — Ambulatory Visit: Payer: Federal, State, Local not specified - PPO

## 2019-08-03 ENCOUNTER — Ambulatory Visit
Admission: EM | Admit: 2019-08-03 | Discharge: 2019-08-03 | Disposition: A | Discharge status: Home / Self Care | Payer: Federal, State, Local not specified - PPO | Attending: Emergency Medicine | Admitting: Emergency Medicine

## 2019-08-03 ENCOUNTER — Other Ambulatory Visit: Payer: Self-pay

## 2019-08-03 ENCOUNTER — Encounter: Payer: Self-pay | Admitting: Emergency Medicine

## 2019-08-03 DIAGNOSIS — M5442 Lumbago with sciatica, left side: Secondary | ICD-10-CM

## 2019-08-03 MED ORDER — DEXAMETHASONE SODIUM PHOSPHATE 10 MG/ML IJ SOLN
10.0000 mg | Freq: Once | INTRAMUSCULAR | Status: AC
  Administered 2019-08-03: 10 mg via INTRAMUSCULAR

## 2019-08-03 MED ORDER — PREDNISONE 10 MG (21) PO TBPK: ORAL_TABLET | Freq: Every day | ORAL | 0 refills | Status: DC

## 2019-08-03 MED ORDER — CYCLOBENZAPRINE HCL 10 MG PO TABS: 10.0000 mg | ORAL_TABLET | Freq: Every day | ORAL | 0 refills | Status: DC

## 2019-08-03 NOTE — Discharge Instructions (Signed)
Steroid shot given in office Continue conservative management of rest, ice, massage, and gentle stretches Take naproxen as needed for pain relief (may cause abdominal discomfort, ulcers, and GI bleeds avoid taking with other NSAIDs) Take cyclobenzaprine at nighttime for symptomatic relief. Avoid driving or operating heavy machinery while using medication. Follow up with PCP if symptoms persist Return or go to the ER if you have any new or worsening symptoms (fever, chills, chest pain, abdominal pain, changes in bowel or bladder habits, pain radiating into lower legs, etc...)

## 2019-08-03 NOTE — ED Provider Notes (Signed)
[facility]   [id number] 08/03/19 Arrival Time: 0805  CC: Back PAIN  SUBJECTIVE: History from: patient. [name] is a 59 y.o. male complains of low back pain x 2 days.  Denies a precipitating event or specific injury.  States he was walking down the drive way and felt a pop in his low back.  Localizes the pain to the low back.  Describes the pain as constant and dull achy in character.  Reports improvement with rest/ sitting.  Symptoms are made worse with walking.  Denies similar symptoms in the past.  Denies fever, chills, erythema, ecchymosis, effusion, weakness, numbness and tingling, saddle paresthesias, loss of bowel or bladder function.      ROS: As per HPI.  All other pertinent ROS negative.     Past Medical History:  Diagnosis Date  . Hypertension    Past Surgical History:  Procedure Laterality Date  . CHOLECYSTECTOMY     No Known Allergies No current facility-administered medications on file prior to encounter.   Current Outpatient Medications on File Prior to Encounter  Medication Sig Dispense Refill  . buPROPion (WELLBUTRIN XL) 300 MG 24 hr tablet Take 300 mg by mouth daily.    [name] Kitchen ibuprofen (ADVIL,MOTRIN) 200 MG tablet Take 400-600 mg by mouth daily as needed for mild pain or moderate pain.    [name] Kitchen losartan (COZAAR) 100 MG tablet Take 100 mg by mouth daily.     Social History   Socioeconomic History  . Marital status: Single    Spouse name: Not on file  . Number of children: Not on file  . Years of education: Not on file  . Highest education level: Not on file  Occupational History  . Not on file  Tobacco Use  . Smoking status: Never Smoker  . Smokeless tobacco: Never Used  Substance and Sexual Activity  . Alcohol use: Never  . Drug use: Never  . Sexual activity: Not on file  Other Topics Concern  . Not on file  Social History Narrative  . Not on file   Social Determinants of Health   Financial Resource Strain:   . Difficulty of Paying  Living Expenses:   Food Insecurity:   . Worried About Programme researcher, broadcasting/film/video in the Last Year:   . Barista in the Last Year:   Transportation Needs:   . Freight forwarder (Medical):   [name] Kitchen Lack of Transportation (Non-Medical):   Physical Activity:   . Days of Exercise per Week:   . Minutes of Exercise per Session:   Stress:   . Feeling of Stress :   Social Connections:   . Frequency of Communication with Friends and Family:   . Frequency of Social Gatherings with Friends and Family:   . Attends Religious Services:   . Active Member of Clubs or Organizations:   . Attends Banker Meetings:   [name] Kitchen Marital Status:   Intimate Partner Violence:   . Fear of Current or Ex-Partner:   . Emotionally Abused:   [name] Kitchen Physically Abused:   . Sexually Abused:    Family History  Problem Relation Age of Onset  . Healthy Mother   . Diabetes Father   . Hypotension Father     OBJECTIVE:  Vitals:   08/03/19 0817  BP: (!) 181/100  Pulse: 79  Resp: 16  Temp: 98.8 F (37.1 C)  TempSrc: Tympanic  SpO2: 96%    General appearance: ALERT; in no acute distress.  Head: NCAT Lungs: Normal respiratory effort; CTAB CV: RRR Musculoskeletal: Back Inspection: Skin warm, dry, clear and intact without obvious erythema, effusion, or ecchymosis.  Palpation: TTP over bilateral lumbar paravertebral muscles ROM: FROM active and passive Strength: 5/5 shld abduction, 5/5 shld adduction, 5/5 elbow flexion, 5/5 elbow extension, 5/5 grip strength, 5/5 hip flexion, 5/5 knee flexion, 5/5 knee extension Skin: warm and dry Neurologic: Ambulates with minimal difficulty; Sensation intact about the upper/ lower extremities Psychological: alert and cooperative; normal mood and affect  ASSESSMENT & PLAN:  1. Acute bilateral low back pain with left-sided sciatica     Meds ordered this encounter  Medications  . predniSONE (STERAPRED UNI-PAK 21 TAB) 10 MG (21) TBPK tablet    Sig: Take by mouth  daily. Take 6 tabs by mouth daily  for 2 days, then 5 tabs for 2 days, then 4 tabs for 2 days, then 3 tabs for 2 days, 2 tabs for 2 days, then 1 tab by mouth daily for 2 days    Dispense:  42 tablet    Refill:  0    Order Specific Question:   Supervising Provider    Answer:   Raylene Everts [[id number]]  . cyclobenzaprine (FLEXERIL) 10 MG tablet    Sig: Take 1 tablet (10 mg total) by mouth at bedtime.    Dispense:  15 tablet    Refill:  0    Order Specific Question:   Supervising Provider    Answer:   Raylene Everts [[id number]]  . dexamethasone (DECADRON) injection 10 mg   Steroid shot given in office Continue conservative management of rest, ice, massage, and gentle stretches Take naproxen as needed for pain relief (may cause abdominal discomfort, ulcers, and GI bleeds avoid taking with other NSAIDs) Take cyclobenzaprine at nighttime for symptomatic relief. Avoid driving or operating heavy machinery while using medication. Follow up with PCP if symptoms persist Return or go to the ER if you have any new or worsening symptoms (fever, chills, chest pain, abdominal pain, changes in bowel or bladder habits, pain radiating into lower legs, etc...)   [place] Controlled Substances Registry consulted for this patient. I feel the risk/benefit ratio today is favorable for proceeding with this prescription for a controlled substance. Medication sedation precautions given.  Reviewed expectations re: course of current medical issues. Questions answered. Outlined signs and symptoms indicating need for more acute intervention. Patient verbalized understanding. After Visit Summary given.    Stacey Drain [place], PA-C 08/03/19 [phone]

## 2019-08-03 NOTE — ED Triage Notes (Signed)
Pt presents with complaints of pain in his back since Saturday since he heard a pop. Reports that his pain is in his left hip down into his knee. Reports it is a dull ache constantly. Denies injury.

## 2019-08-04 ENCOUNTER — Ambulatory Visit: Payer: Federal, State, Local not specified - PPO

## 2019-08-06 DIAGNOSIS — E6609 Other obesity due to excess calories: Secondary | ICD-10-CM | POA: Diagnosis not present

## 2019-08-06 DIAGNOSIS — Z6835 Body mass index (BMI) 35.0-35.9, adult: Secondary | ICD-10-CM | POA: Diagnosis not present

## 2019-08-06 DIAGNOSIS — M545 Low back pain: Secondary | ICD-10-CM | POA: Diagnosis not present

## 2019-08-06 DIAGNOSIS — M5410 Radiculopathy, site unspecified: Secondary | ICD-10-CM | POA: Diagnosis not present

## 2019-08-11 ENCOUNTER — Ambulatory Visit: Payer: Federal, State, Local not specified - PPO | Attending: Internal Medicine

## 2019-08-11 DIAGNOSIS — Z23 Encounter for immunization: Secondary | ICD-10-CM

## 2019-08-11 NOTE — Progress Notes (Signed)
   Covid-19 Vaccination Clinic  Name:  [name]    MRN: [id number] DOB: 1960-10-26  08/11/2019  [name] was observed post Covid-19 immunization for 15 minutes without incident. He was provided with Vaccine Information Sheet and instruction to access the V-Safe system.   [name] was instructed to call 911 with any severe reactions post vaccine: [name] Kitchen Difficulty breathing  . Swelling of face and throat  . A fast heartbeat  . A bad rash all over body  . Dizziness and weakness   Immunizations Administered    Name Date Dose VIS Date Route   Moderna COVID-19 Vaccine 08/11/2019 10:57 AM 0.5 mL 03/2019 Intramuscular   Manufacturer: Moderna   Lot: [id number]   NDC: 75449-201-00

## 2019-08-18 ENCOUNTER — Ambulatory Visit
Admission: EM | Admit: 2019-08-18 | Discharge: 2019-08-18 | Disposition: A | Discharge status: Home / Self Care | Payer: Federal, State, Local not specified - PPO | Attending: Family Medicine | Admitting: Family Medicine

## 2019-08-18 ENCOUNTER — Other Ambulatory Visit: Payer: Self-pay

## 2019-08-18 DIAGNOSIS — R5383 Other fatigue: Secondary | ICD-10-CM | POA: Diagnosis not present

## 2019-08-18 DIAGNOSIS — R600 Localized edema: Secondary | ICD-10-CM

## 2019-08-18 DIAGNOSIS — I10 Essential (primary) hypertension: Secondary | ICD-10-CM

## 2019-08-18 MED ORDER — HYDROCHLOROTHIAZIDE 25 MG PO TABS: 25.0000 mg | ORAL_TABLET | Freq: Every day | ORAL | 1 refills | Status: DC

## 2019-08-18 NOTE — ED Provider Notes (Signed)
[facility]    CSN: [id number] Arrival date & time: 08/18/19  1726      History   Chief Complaint Chief Complaint  Patient presents with  . Hypertension    HPI [name] is a 59 y.o. male.   Patient reports that he has been experiencing higher blood pressure than usual.  Reports that he takes losartan 100 mg daily.  Reports blood pressure earlier on his home cuff was 192/104.  Denies chest pain, shortness of breath, chest pressure.  Reports that he has sublingual nitro for use with chest pain.  Reports that he has never had to use this before.  Reports that he feels puffy and that his legs are swelling, he feels like even his hands are swelling today.  Patient has medical history significant for hypertension.  ROS per HPI  The history is provided by the patient.    Past Medical History:  Diagnosis Date  . Hypertension     There are no problems to display for this patient.   Past Surgical History:  Procedure Laterality Date  . CHOLECYSTECTOMY         Home Medications    Prior to Admission medications   Medication Sig Start Date End Date Taking? Authorizing Provider  buPROPion (WELLBUTRIN XL) 300 MG 24 hr tablet Take 300 mg by mouth daily. 05/01/19   [provider]  cyclobenzaprine (FLEXERIL) 10 MG tablet Take 1 tablet (10 mg total) by mouth at bedtime. 08/03/19   W[place]st, [place], PA-C  hydrochlorothiazide (HYDRODIURIL) 25 MG tablet Take 1 tablet (25 mg total) by mouth daily. 08/18/19 10/17/19  Moshe Cipro, NP  ibuprofen (ADVIL,MOTRIN) 200 MG tablet Take 400-600 mg by mouth daily as needed for mild pain or moderate pain.    [provider]  losartan (COZAAR) 100 MG tablet Take 100 mg by mouth daily.    [provider]  predniSONE (STERAPRED UNI-PAK 21 TAB) 10 MG (21) TBPK tablet Take by mouth daily. Take 6 tabs by mouth daily  for 2 days, then 5 tabs for 2 days, then 4 tabs for 2 days, then 3 tabs for 2 days, 2 tabs for  2 days, then 1 tab by mouth daily for 2 days 08/03/19   Alvino Chapel [place], PA-C    Family History Family History  Problem Relation Age of Onset  . Healthy Mother   . Diabetes Father   . Hypotension Father     Social History Social History   Tobacco Use  . Smoking status: Never Smoker  . Smokeless tobacco: Never Used  Substance Use Topics  . Alcohol use: Never  . Drug use: Never     Allergies   Patient has no known allergies.   Review of Systems Review of Systems   Physical Exam Triage Vital Signs ED Triage Vitals  Enc Vitals Group     BP 08/18/19 1731 (!) 158/96     Pulse Rate 08/18/19 1731 73     Resp 08/18/19 1731 16     Temp 08/18/19 1731 97.8 F (36.6 C)     Temp Source 08/18/19 1731 Oral     SpO2 08/18/19 1731 96 %     Weight --      Height --      Head Circumference --      Peak Flow --      Pain Score 08/18/19 1737 0     Pain Loc --      Pain Edu? --  Excl. in GC? --    No data found.  Updated Vital Signs BP (!) 158/96 (BP Location: Right Arm)   Pulse 73   Temp 97.8 F (36.6 C) (Oral)   Resp 16   SpO2 96%   Visual Acuity Right Eye Distance:   Left Eye Distance:   Bilateral Distance:    Right Eye Near:   Left Eye Near:    Bilateral Near:     Physical Exam Vitals and nursing note reviewed.  Constitutional:      General: He is not in acute distress.    Appearance: Normal appearance. He is well-developed and normal weight. He is not ill-appearing.  HENT:     Head: Normocephalic and atraumatic.  Eyes:     Conjunctiva/sclera: Conjunctivae normal.  Cardiovascular:     Rate and Rhythm: Normal rate and regular rhythm.     Pulses: Normal pulses.     Heart sounds: Normal heart sounds, S1 normal and S2 normal. No murmur.  Pulmonary:     Effort: Pulmonary effort is normal. No respiratory distress.     Breath sounds: Normal breath sounds.  Abdominal:     Palpations: Abdomen is soft.     Tenderness: There is no abdominal tenderness.   Musculoskeletal:     Cervical back: Neck supple.     Right lower leg: 2+ Pitting Edema present.     Left lower leg: 2+ Pitting Edema present.  Skin:    General: Skin is warm and dry.  Neurological:     Mental Status: He is alert.      UC Treatments / Results  Labs (all labs ordered are listed, but only abnormal results are displayed) Labs Reviewed  CBC WITH DIFFERENTIAL/PLATELET  COMPREHENSIVE METABOLIC PANEL    EKG   Radiology No results found.  Procedures Procedures (including critical care time)  Medications Ordered in UC Medications - No data to display  Initial Impression / Assessment and Plan / UC Course  I have reviewed the triage vital signs and the nursing notes.  Pertinent labs & imaging results that were available during my care of the patient were reviewed by me and considered in my medical decision making (see chart for details).     Fatigue Edema Hypertension: Presents with hypertension over the last few months.  Takes 100 mg Cozaar daily and still continuing to have high blood pressures.  Did just finished a round of steroids for hip pain about a week ago.  Also has not been paying much attention to what he is eating.  Patient is well-appearing, no acute distress.  Lung sounds CTA bilaterally, heart sounds normal, S1-S2.  Bilateral lower extremity 2+ pitting edema.  We will do CBC to check iron levels for fatigue, as well as CMP to check kidney function and see if this is causing any of the swelling.  Will prescribe HCTZ 25 mg for him to start tomorrow for tonight, he may take another half pill of his 100 mg Cozaar.  He is going to follow-up with primary care later this week.  Instructed patient that results will be available via MyChart.  If there are any results that require treatment, we will contact him and let him know. Final Clinical Impressions(s) / UC Diagnoses   Final diagnoses:  Essential hypertension  Other fatigue  Localized edema      Discharge Instructions     I have added HCTZ 25mg  for you to take daily in the morning.  Decrease salt in the  diet.   Follow up with primary care as well.  We have drawn a CBC with diff and CMP today. These results will be available via MyChart.  If there are results that require treatment or change in therapy, we will contact you let you know.    ED Prescriptions    Medication Sig Dispense Auth. Provider   hydrochlorothiazide (HYDRODIURIL) 25 MG tablet Take 1 tablet (25 mg total) by mouth daily. 30 tablet Moshe Cipro, NP     PDMP not reviewed this encounter.   Moshe Cipro, NP 08/18/19 1811

## 2019-08-18 NOTE — ED Triage Notes (Signed)
Pt presents with c/o hypertension

## 2019-08-18 NOTE — Discharge Instructions (Addendum)
I have added HCTZ 25mg  for you to take daily in the morning.  Decrease salt in the diet.   Follow up with primary care as well.  We have drawn a CBC with diff and CMP today. These results will be available via MyChart.  If there are results that require treatment or change in therapy, we will contact you let you know.

## 2019-08-19 LAB — COMPREHENSIVE METABOLIC PANEL
ALT: 27 IU/L (ref 0–44)
AST: 22 IU/L (ref 0–40)
Albumin/Globulin Ratio: 1.7 (ref 1.2–2.2)
Albumin: 4.3 g/dL (ref 3.8–4.9)
Alkaline Phosphatase: 91 IU/L (ref 39–117)
BUN/Creatinine Ratio: 14 (ref 9–20)
BUN: 13 mg/dL (ref 6–24)
Bilirubin Total: 0.5 mg/dL (ref 0.0–1.2)
CO2: 22 mmol/L (ref 20–29)
Calcium: 9.4 mg/dL (ref 8.7–10.2)
Chloride: 100 mmol/L (ref 96–106)
Creatinine, Ser: 0.94 mg/dL (ref 0.76–1.27)
GFR calc Af Amer: 102 mL/min/{1.73_m2} (ref >59)
GFR calc non Af Amer: 88 mL/min/{1.73_m2} (ref >59)
Globulin, Total: 2.5 g/dL (ref 1.5–4.5)
Glucose: 93 mg/dL (ref 65–99)
Potassium: 3.7 mmol/L (ref 3.5–5.2)
Sodium: 140 mmol/L (ref 134–144)
Total Protein: 6.8 g/dL (ref 6.0–8.5)

## 2019-08-19 LAB — CBC WITH DIFFERENTIAL/PLATELET
Basophils Absolute: 0.1 10*3/uL (ref 0.0–0.2)
Basos: 1 %
EOS (ABSOLUTE): 0.1 10*3/uL (ref 0.0–0.4)
Eos: 1 %
Hematocrit: 47.9 % (ref 37.5–51.0)
Hemoglobin: 16.4 g/dL (ref 13.0–17.7)
Immature Grans (Abs): 0.2 10*3/uL — ABNORMAL HIGH (ref 0.0–0.1)
Immature Granulocytes: 1 %
Lymphocytes Absolute: 3.3 10*3/uL — ABNORMAL HIGH (ref 0.7–3.1)
Lymphs: 25 %
MCH: 29.7 pg (ref 26.6–33.0)
MCHC: 34.2 g/dL (ref 31.5–35.7)
MCV: 87 fL (ref 79–97)
Monocytes Absolute: 1 10*3/uL — ABNORMAL HIGH (ref 0.1–0.9)
Monocytes: 8 %
Neutrophils Absolute: 8.4 10*3/uL — ABNORMAL HIGH (ref 1.4–7.0)
Neutrophils: 64 %
Platelets: 337 10*3/uL (ref 150–450)
RBC: 5.53 x10E6/uL (ref 4.14–5.80)
RDW: 13.8 % (ref 11.6–15.4)
WBC: 13 10*3/uL — ABNORMAL HIGH (ref 3.4–10.8)

## 2019-08-25 DIAGNOSIS — I10 Essential (primary) hypertension: Secondary | ICD-10-CM | POA: Diagnosis not present

## 2019-08-27 ENCOUNTER — Other Ambulatory Visit: Payer: Self-pay

## 2019-08-27 ENCOUNTER — Emergency Department (HOSPITAL_COMMUNITY): Payer: Federal, State, Local not specified - PPO

## 2019-08-27 ENCOUNTER — Emergency Department (HOSPITAL_COMMUNITY)
Admission: EM | Admit: 2019-08-27 | Discharge: 2019-08-27 | Disposition: A | Discharge status: Home / Self Care | Payer: Federal, State, Local not specified - PPO | Attending: Emergency Medicine | Admitting: Emergency Medicine

## 2019-08-27 ENCOUNTER — Encounter (HOSPITAL_COMMUNITY): Payer: Self-pay | Admitting: *Deleted

## 2019-08-27 DIAGNOSIS — I10 Essential (primary) hypertension: Secondary | ICD-10-CM

## 2019-08-27 DIAGNOSIS — M79602 Pain in left arm: Secondary | ICD-10-CM | POA: Diagnosis not present

## 2019-08-27 DIAGNOSIS — M79601 Pain in right arm: Secondary | ICD-10-CM | POA: Diagnosis not present

## 2019-08-27 DIAGNOSIS — R079 Chest pain, unspecified: Secondary | ICD-10-CM | POA: Diagnosis not present

## 2019-08-27 DIAGNOSIS — E876 Hypokalemia: Secondary | ICD-10-CM

## 2019-08-27 DIAGNOSIS — Z79899 Other long term (current) drug therapy: Secondary | ICD-10-CM | POA: Insufficient documentation

## 2019-08-27 LAB — COMPREHENSIVE METABOLIC PANEL
ALT: 24 U/L (ref 0–44)
AST: 19 U/L (ref 15–41)
Albumin: 4 g/dL (ref 3.5–5.0)
Alkaline Phosphatase: 81 U/L (ref 38–126)
Anion gap: 8 (ref 5–15)
BUN: 11 mg/dL (ref 6–20)
CO2: 27 mmol/L (ref 22–32)
Calcium: 8.9 mg/dL (ref 8.9–10.3)
Chloride: 101 mmol/L (ref 98–111)
Creatinine, Ser: 0.9 mg/dL (ref 0.61–1.24)
GFR calc Af Amer: >60 mL/min (ref >60)
GFR calc non Af Amer: >60 mL/min (ref >60)
Glucose, Bld: 107 mg/dL — ABNORMAL HIGH (ref 70–99)
Potassium: 2.9 mmol/L — ABNORMAL LOW (ref 3.5–5.1)
Sodium: 136 mmol/L (ref 135–145)
Total Bilirubin: 1.1 mg/dL (ref 0.3–1.2)
Total Protein: 7.2 g/dL (ref 6.5–8.1)

## 2019-08-27 LAB — CBC WITH DIFFERENTIAL/PLATELET
Abs Immature Granulocytes: 0.07 10*3/uL (ref 0.00–0.07)
Basophils Absolute: 0.1 10*3/uL (ref 0.0–0.1)
Basophils Relative: 1 %
Eosinophils Absolute: 0.2 10*3/uL (ref 0.0–0.5)
Eosinophils Relative: 2 %
HCT: 47.5 % (ref 39.0–52.0)
Hemoglobin: 16 g/dL (ref 13.0–17.0)
Immature Granulocytes: 1 %
Lymphocytes Relative: 18 %
Lymphs Abs: 1.8 10*3/uL (ref 0.7–4.0)
MCH: 29.3 pg (ref 26.0–34.0)
MCHC: 33.7 g/dL (ref 30.0–36.0)
MCV: 86.8 fL (ref 80.0–100.0)
Monocytes Absolute: 0.8 10*3/uL (ref 0.1–1.0)
Monocytes Relative: 8 %
Neutro Abs: 7.4 10*3/uL (ref 1.7–7.7)
Neutrophils Relative %: 70 %
Platelets: 316 10*3/uL (ref 150–400)
RBC: 5.47 MIL/uL (ref 4.22–5.81)
RDW: 13.3 % (ref 11.5–15.5)
WBC: 10.4 10*3/uL (ref 4.0–10.5)
nRBC: 0 % (ref 0.0–0.2)

## 2019-08-27 LAB — TROPONIN I (HIGH SENSITIVITY)
Troponin I (High Sensitivity): 5 ng/L (ref <18)
Troponin I (High Sensitivity): 5 ng/L (ref <18)

## 2019-08-27 MED ORDER — POTASSIUM CHLORIDE 10 MEQ/100ML IV SOLN
10.0000 meq | Freq: Once | INTRAVENOUS | Status: AC
  Administered 2019-08-27: 17:00:00 10 meq via INTRAVENOUS
  Filled 2019-08-27: qty 100

## 2019-08-27 MED ORDER — POTASSIUM CHLORIDE CRYS ER 20 MEQ PO TBCR
20.0000 meq | EXTENDED_RELEASE_TABLET | Freq: Two times a day (BID) | ORAL | 0 refills | Status: DC
Start: 2019-08-27 — End: 2022-01-15

## 2019-08-27 MED ORDER — POTASSIUM CHLORIDE CRYS ER 20 MEQ PO TBCR
40.0000 meq | EXTENDED_RELEASE_TABLET | Freq: Once | ORAL | Status: AC
  Administered 2019-08-27: 17:00:00 40 meq via ORAL
  Filled 2019-08-27: qty 2

## 2019-08-27 NOTE — ED Triage Notes (Signed)
States he is having trouble keeping his blood pressure under control

## 2019-08-27 NOTE — ED Triage Notes (Signed)
States he has recently had several medications changes including the 2nd covid shot, states he just feels heavy

## 2019-08-27 NOTE — Discharge Instructions (Addendum)
Your test today overall were reassuring.  Your blood work shows that your potassium level was low.  You will need to continue to take potassium by mouth as prescribed.  You will need to have your potassium level rechecked by your primary care provider in 1 week.  Return to the emergency department for any worsening symptoms

## 2019-08-27 NOTE — ED Provider Notes (Signed)
[facility] EMERGENCY DEPARTMENT Provider Note   CSN: [id number] Arrival date & time: 08/27/19  1333     History Chief Complaint  Patient presents with  . Hypertension    [name] is a 59 y.o. male.  HPI      [name] is a 59 y.o. male with past medical history for hypertension.  He presents to the Emergency Department, sent from his PCPs office for evaluation of cardiac process.  He complains of "a heavy feeling of both my arms."  He describes the heaviness feeling has been associated with a burning of his arms.  This has been going on for several days.  He does state that he has recently been on several doses of oral steroids and received 2-3 steroid injections for hip pain.  He was seen at the local urgent care the latter part of April and evaluated for hypertension.  He takes losartan daily and hydrochlorothiazide 25 mg daily was added to his regimen.  No other new medications.  He has since stopped taking the oral steroids as his hip pain has improved.  He denies facial droop, difficulty with speech or recall, headaches, dizziness, fever, chest pain, shortness of breath, weakness of the extremities, and visual changes   Past Medical History:  Diagnosis Date  . Hypertension     There are no problems to display for this patient.   Past Surgical History:  Procedure Laterality Date  . CHOLECYSTECTOMY       Family History  Problem Relation Age of Onset  . Healthy Mother   . Diabetes Father   . Hypotension Father     Social History   Tobacco Use  . Smoking status: Never Smoker  . Smokeless tobacco: Never Used  Substance Use Topics  . Alcohol use: Never  . Drug use: Never    Home Medications Prior to Admission medications   Medication Sig Start Date End Date Taking? Authorizing Provider  buPROPion (WELLBUTRIN XL) 300 MG 24 hr tablet Take 300 mg by mouth daily. 05/01/19   [provider]  cyclobenzaprine (FLEXERIL) 10 MG tablet Take 1 tablet  (10 mg total) by mouth at bedtime. 08/03/19   Wurst, [place], PA-C  hydrochlorothiazide (HYDRODIURIL) 25 MG tablet Take 1 tablet (25 mg total) by mouth daily. 08/18/19 10/17/19  Moshe Cipro, NP  hydrochlorothiazide (HYDRODIURIL) 25 MG tablet Take 1 tablet (25 mg total) by mouth daily. 08/18/19   Moshe Cipro, NP  ibuprofen (ADVIL,MOTRIN) 200 MG tablet Take 400-600 mg by mouth daily as needed for mild pain or moderate pain.    [provider]  losartan (COZAAR) 100 MG tablet Take 100 mg by mouth daily.    [provider]  predniSONE (STERAPRED UNI-PAK 21 TAB) 10 MG (21) TBPK tablet Take by mouth daily. Take 6 tabs by mouth daily  for 2 days, then 5 tabs for 2 days, then 4 tabs for 2 days, then 3 tabs for 2 days, 2 tabs for 2 days, then 1 tab by mouth daily for 2 days 08/03/19   Alvino Chapel [place], PA-C    Allergies    Patient has no known allergies.  Review of Systems   Review of Systems  Constitutional: Negative for activity change and appetite change.  HENT: Negative for sore throat and trouble swallowing.   Eyes: Negative for visual disturbance.  Respiratory: Negative for chest tightness and shortness of breath.   Cardiovascular: Negative for chest pain.  Gastrointestinal: Negative for abdominal pain, diarrhea, nausea  and vomiting.  Genitourinary: Negative for dysuria.  Musculoskeletal: Positive for myalgias ("heaviness" bilateral arms). Negative for arthralgias, neck pain and neck stiffness.  Skin: Negative for color change and rash.  Neurological: Negative for dizziness, seizures, syncope, facial asymmetry, speech difficulty, weakness, light-headedness, numbness and headaches.  Psychiatric/Behavioral: Negative for confusion.    Physical Exam Updated Vital Signs BP (!) 158/94 (BP Location: Right Arm)   Pulse 71   Temp 98.2 F (36.8 C) (Oral)   Ht 5\' 10"  (1.778 m)   Wt 108.9 kg   SpO2 97%   BMI 34.44 kg/m   Physical Exam Vitals and nursing note  reviewed.  Constitutional:      General: He is not in acute distress.    Appearance: Normal appearance. He is not ill-appearing.  HENT:     Head: Normocephalic.     Mouth/Throat:     Mouth: Mucous membranes are moist.  Eyes:     Extraocular Movements: Extraocular movements intact.     Conjunctiva/sclera: Conjunctivae normal.     Pupils: Pupils are equal, round, and reactive to light.  Neck:     Thyroid: No thyromegaly.     Meningeal: Kernig's sign absent.  Cardiovascular:     Rate and Rhythm: Normal rate and regular rhythm.     Pulses: Normal pulses.  Pulmonary:     Effort: Pulmonary effort is normal. No respiratory distress.     Breath sounds: Normal breath sounds.  Chest:     Chest wall: No tenderness.  Abdominal:     Palpations: Abdomen is soft.     Tenderness: There is no abdominal tenderness. There is no guarding or rebound.  Musculoskeletal:        General: Normal range of motion.     Cervical back: Normal range of motion and neck supple.     Right lower leg: No edema.     Left lower leg: No edema.  Skin:    General: Skin is warm.     Capillary Refill: Capillary refill takes less than 2 seconds.     Findings: No rash.  Neurological:     General: No focal deficit present.     Mental Status: He is alert.     GCS: GCS eye subscore is 4. GCS verbal subscore is 5. GCS motor subscore is 6.     Sensory: Sensation is intact. No sensory deficit.     Motor: Motor function is intact. No weakness.     Coordination: Coordination normal.     Comments: CN II-XII intact.  Speech clear.  No pronator drift.  nml finger nose testing.       ED Results / Procedures / Treatments   Labs (all labs ordered are listed, but only abnormal results are displayed) Labs Reviewed  COMPREHENSIVE METABOLIC PANEL - Abnormal; Notable for the following components:      Result Value   Potassium 2.9 (*)    Glucose, Bld 107 (*)    All other components within normal limits  CBC WITH  DIFFERENTIAL/PLATELET  TROPONIN I (HIGH SENSITIVITY)  TROPONIN I (HIGH SENSITIVITY)    EKG None    Date: 08/27/2019  Rate: 64  Rhythm: normal sinus rhythm  QRS Axis: normal  Intervals: normal  ST/T Wave abnormalities: normal  Conduction Disutrbances: none  Narrative Interpretation: nml EKG  EKG reviewed by Dr. Roderic Palau      Radiology DG Chest Portable 1 View  Result Date: 08/27/2019 CLINICAL DATA:  Weakness.  Chest pain.  Arm pain. EXAM:  PORTABLE CHEST 1 VIEW COMPARISON:  None. FINDINGS: The heart size and mediastinal contours are within normal limits. Both lungs are clear. The visualized skeletal structures are unremarkable. IMPRESSION: No active disease. Electronically Signed   By: Katherine Mantle M.D.   On: 08/27/2019 15:24    Procedures Procedures (including critical care time)  Medications Ordered in ED Medications  potassium chloride SA (KLOR-CON) CR tablet 40 mEq (40 mEq Oral Given 08/27/19 1721)  potassium chloride 10 mEq in 100 mL IVPB (0 mEq Intravenous Stopped 08/27/19 1823)    ED Course  I have reviewed the triage vital signs and the nursing notes.  Pertinent labs & imaging results that were available during my care of the patient were reviewed by me and considered in my medical decision making (see chart for details).    MDM Rules/Calculators/A&P                      Patient seen here for cardiac evaluation by PCP.  No chest pain or dyspnea on exam. Reported "heaviness" of the bilateral arms.  No focal neuro deficits on my exam.   Vital signs reviewed.  I will obtain chest x-ray, EKG, and laboratory studies including troponin.  Chest x-ray and EKG are reassuring.  Basic metabolic panel shows hypokalemia with a potassium level of 2.9.  Patient was recently started on HCTZ which is the likely cause of his hypokalemia. IV and oral potassium ordered.  On recheck, pt well appearing and vitals reviewed.  Continues to deny chest pain or dyspnea.  Delta troponin  unchanged.  Symptoms likely related to his hypokalemia.  I feel that his is appropriate for d/c home, I will provide RX for potassium and he agrees to close out pt f/u.  PCP is planning to schedule pt for stress test.  Strict return precautions discussed.    Final Clinical Impression(s) / ED Diagnoses Final diagnoses:  Hypokalemia  Hypertension, unspecified type    Rx / DC Orders ED Discharge Orders    None       Rosey Bath 08/27/19 2054    Bethann Berkshire, MD 08/28/19 [phone]

## 2019-12-30 DIAGNOSIS — E559 Vitamin D deficiency, unspecified: Secondary | ICD-10-CM | POA: Diagnosis not present

## 2019-12-30 DIAGNOSIS — E7849 Other hyperlipidemia: Secondary | ICD-10-CM | POA: Diagnosis not present

## 2019-12-30 DIAGNOSIS — G4730 Sleep apnea, unspecified: Secondary | ICD-10-CM | POA: Diagnosis not present

## 2019-12-31 DIAGNOSIS — Z20828 Contact with and (suspected) exposure to other viral communicable diseases: Secondary | ICD-10-CM | POA: Diagnosis not present

## 2019-12-31 DIAGNOSIS — G4730 Sleep apnea, unspecified: Secondary | ICD-10-CM | POA: Diagnosis not present

## 2019-12-31 DIAGNOSIS — E559 Vitamin D deficiency, unspecified: Secondary | ICD-10-CM | POA: Diagnosis not present

## 2019-12-31 DIAGNOSIS — E7849 Other hyperlipidemia: Secondary | ICD-10-CM | POA: Diagnosis not present

## 2020-02-10 DIAGNOSIS — R200 Anesthesia of skin: Secondary | ICD-10-CM | POA: Diagnosis not present

## 2020-02-22 DIAGNOSIS — Z23 Encounter for immunization: Secondary | ICD-10-CM | POA: Diagnosis not present

## 2020-02-22 DIAGNOSIS — R200 Anesthesia of skin: Secondary | ICD-10-CM | POA: Diagnosis not present

## 2020-02-22 DIAGNOSIS — Z6835 Body mass index (BMI) 35.0-35.9, adult: Secondary | ICD-10-CM | POA: Diagnosis not present

## 2020-02-22 DIAGNOSIS — E6609 Other obesity due to excess calories: Secondary | ICD-10-CM | POA: Diagnosis not present

## 2020-02-22 DIAGNOSIS — R7309 Other abnormal glucose: Secondary | ICD-10-CM | POA: Diagnosis not present

## 2020-02-22 DIAGNOSIS — Z1331 Encounter for screening for depression: Secondary | ICD-10-CM | POA: Diagnosis not present

## 2020-03-29 ENCOUNTER — Telehealth: Payer: Self-pay | Admitting: Neurology

## 2020-03-29 ENCOUNTER — Encounter: Payer: Self-pay | Admitting: Neurology

## 2020-03-29 ENCOUNTER — Ambulatory Visit: Payer: Federal, State, Local not specified - PPO | Admitting: Neurology

## 2020-03-29 VITALS — BP 162/87 | HR 73 | Ht 70.0 in | Wt 240.2 lb

## 2020-03-29 DIAGNOSIS — R202 Paresthesia of skin: Secondary | ICD-10-CM

## 2020-03-29 MED ORDER — GABAPENTIN 300 MG PO CAPS: 300.0000 mg | ORAL_CAPSULE | Freq: Every day | ORAL | 3 refills | Status: DC

## 2020-03-29 NOTE — Progress Notes (Signed)
Reason for visit: Numbness in the feet  Referring physician: Dr. Lebron Conners is a 59 y.o. male  History of present illness:  Mr. Engelmann is a 59 year old right-handed white male with a 33-month history of some tingling and paresthesias and discomfort in the great toe and the second toe with an altered sensation in the ball of the feet as well.  He feels as it there is something balled up underneath his foot when he walks.  He may have some tingling and burning sensations in the feet at nighttime, he may also have some discomfort in the feet with walking.  He has had some problems with low back pain, he has had some sciatica pain down the left leg 8 or 9 months ago, but this is no longer occurring.  Occasionally he may note a brief 2 or 3-minute episode of tingling into the anterolateral thigh on the right.  He denies any weakness in the legs or difficulty controlling the bowels or the bladder.  He has not had any change in balance.  He reports no neck pain or pain down the arms and denies any numbness in the hands.  He is sent to this office for evaluation of the above symptoms.  Past Medical History:  Diagnosis Date  . Hypertension     Past Surgical History:  Procedure Laterality Date  . CHOLECYSTECTOMY      Family History  Problem Relation Age of Onset  . Healthy Mother   . Diabetes Father   . Hypotension Father     Social history:  reports that he has never smoked. He has never used smokeless tobacco. He reports that he does not drink alcohol and does not use drugs.  Medications:  Prior to Admission medications   Medication Sig Start Date End Date Taking? Authorizing Provider  buPROPion (WELLBUTRIN XL) 300 MG 24 hr tablet Take 300 mg by mouth daily. 05/01/19   [provider]  cyclobenzaprine (FLEXERIL) 10 MG tablet Take 1 tablet (10 mg total) by mouth at bedtime. 08/03/19   Wurst, [place], PA-C  hydrochlorothiazide (HYDRODIURIL) 25 MG tablet Take 1 tablet  (25 mg total) by mouth daily. 08/18/19 10/17/19  Moshe Cipro, NP  hydrochlorothiazide (HYDRODIURIL) 25 MG tablet Take 1 tablet (25 mg total) by mouth daily. Patient taking differently: Take 12.5 mg by mouth daily.  08/18/19   Moshe Cipro, NP  ibuprofen (ADVIL,MOTRIN) 200 MG tablet Take 400-600 mg by mouth daily as needed for mild pain or moderate pain.    [provider]  losartan (COZAAR) 100 MG tablet Take 100 mg by mouth daily.    [provider]  potassium chloride SA (KLOR-CON) 20 MEQ tablet Take 1 tablet (20 mEq total) by mouth 2 (two) times daily. 08/27/19   Triplett, Tammy, PA-C  predniSONE (STERAPRED UNI-PAK 21 TAB) 10 MG (21) TBPK tablet Take by mouth daily. Take 6 tabs by mouth daily  for 2 days, then 5 tabs for 2 days, then 4 tabs for 2 days, then 3 tabs for 2 days, 2 tabs for 2 days, then 1 tab by mouth daily for 2 days 08/03/19   Alvino Chapel, [place], PA-C     No Known Allergies  ROS:  Out of a complete 14 system review of symptoms, the patient complains only of the following symptoms, and all other reviewed systems are negative.  Back pain Numbness in the feet  Blood pressure (!) 162/87, pulse 73, height 5\' 10"  (1.778 m), weight  240 lb 3.2 oz (109 kg).  Physical Exam  General: The patient is alert and cooperative at the time of the examination.  Eyes: Pupils are equal, round, and reactive to light. Discs are flat bilaterally.  Neck: The neck is supple, no carotid bruits are noted.  Respiratory: The respiratory examination is clear.  Cardiovascular: The cardiovascular examination reveals a regular rate and rhythm, no obvious murmurs or rubs are noted.  Skin: Extremities are without significant edema.  Neurologic Exam  Mental status: The patient is alert and oriented x 3 at the time of the examination. The patient has apparent normal recent and remote memory, with an apparently normal attention span and concentration ability.  Cranial nerves:  Facial symmetry is present. There is good sensation of the face to pinprick and soft touch bilaterally. The strength of the facial muscles and the muscles to head turning and shoulder shrug are normal bilaterally. Speech is well enunciated, no aphasia or dysarthria is noted. Extraocular movements are full. Visual fields are full. The tongue is midline, and the patient has symmetric elevation of the soft palate. No obvious hearing deficits are noted.  Motor: The motor testing reveals 5 over 5 strength of all 4 extremities. Good symmetric motor tone is noted throughout.  Sensory: Sensory testing is intact to pinprick, soft touch, vibration sensation, and position sense on all 4 extremities.  No definite stocking pattern pinprick sensory deficit is noted.  No evidence of extinction is noted.  Coordination: Cerebellar testing reveals good finger-nose-finger and heel-to-shin bilaterally.  Gait and station: Gait is normal. Tandem gait is normal. Romberg is negative. No drift is seen.  The patient is able to walk on the heels and the toes bilaterally.  Reflexes: Deep tendon reflexes are symmetric and normal bilaterally, but the ankle jerk reflexes are trace bilaterally. Toes are downgoing bilaterally.   Assessment/Plan:  1.  Foot paresthesias, possible early peripheral neuropathy  The patient is having symptoms that could be consistent with a peripheral neuropathy.  The patient will be set up for blood work today, he will come back for nerve conductions on both legs and EMG on 1 leg.  He will follow-up otherwise in 6 months.  We will start gabapentin 300 mg at night, he will also start alpha lipoic acid.  He will return for the EMG evaluation.  Marlan Palau MD 03/29/2020 10:39 AM  [facility] [address] [place], [place] [address]  Phone [phone] Fax [phone]

## 2020-03-29 NOTE — Telephone Encounter (Signed)
Pt called, gabapentin (NEURONTIN) 300 MG capsule prescription was originally sent to the wrong pharmacy ([facility]). Dr. Anne Hahn then sent to the correct pharmacy [facility]) Micah Flesher to [facility] to pick up, pharmacy will not fill because prescription has already been filled. Would like a call from the nurse.

## 2020-03-29 NOTE — Patient Instructions (Signed)
We will start gabapentin 300 mg at night. Start alpha lipoic acid 600 mg a day.

## 2020-03-29 NOTE — Telephone Encounter (Signed)
Called and spoke to patient regarding pharmacy.  Patient stated he would pick the prescription up at [facility] but to update his records so it shows [facility] of [facility].  Pharmacy has been updated as patient requested.

## 2020-04-01 LAB — B. BURGDORFI ANTIBODIES: Lyme IgG/IgM Ab: <0.91 {ISR} (ref 0.00–0.90)

## 2020-04-01 LAB — MULTIPLE MYELOMA PANEL, SERUM
Albumin SerPl Elph-Mcnc: 3.8 g/dL (ref 2.9–4.4)
Albumin/Glob SerPl: 1.4 (ref 0.7–1.7)
Alpha 1: 0.2 g/dL (ref 0.0–0.4)
Alpha2 Glob SerPl Elph-Mcnc: 0.7 g/dL (ref 0.4–1.0)
B-Globulin SerPl Elph-Mcnc: 0.9 g/dL (ref 0.7–1.3)
Gamma Glob SerPl Elph-Mcnc: 0.9 g/dL (ref 0.4–1.8)
Globulin, Total: 2.8 g/dL (ref 2.2–3.9)
IgA/Immunoglobulin A, Serum: 110 mg/dL (ref 90–386)
IgG (Immunoglobin G), Serum: 1000 mg/dL (ref 603–1613)
IgM (Immunoglobulin M), Srm: 61 mg/dL (ref 20–172)
Total Protein: 6.6 g/dL (ref 6.0–8.5)

## 2020-04-01 LAB — COPPER, SERUM: Copper: 97 ug/dL (ref 69–132)

## 2020-04-01 LAB — ENA+DNA/DS+SJORGEN'S
ENA RNP Ab: <0.2 AI (ref 0.0–0.9)
ENA SM Ab Ser-aCnc: <0.2 AI (ref 0.0–0.9)
ENA SSA (RO) Ab: <0.2 AI (ref 0.0–0.9)
ENA SSB (LA) Ab: <0.2 AI (ref 0.0–0.9)
dsDNA Ab: 19 IU/mL — ABNORMAL HIGH (ref 0–9)

## 2020-04-01 LAB — VITAMIN B12: Vitamin B-12: 1092 pg/mL (ref 232–1245)

## 2020-04-01 LAB — ANGIOTENSIN CONVERTING ENZYME: Angio Convert Enzyme: 28 U/L (ref 14–82)

## 2020-04-01 LAB — SEDIMENTATION RATE: Sed Rate: 2 mm/hr (ref 0–30)

## 2020-04-01 LAB — ANA W/REFLEX: Anti Nuclear Antibody (ANA): POSITIVE — AB

## 2020-04-25 ENCOUNTER — Encounter: Payer: Federal, State, Local not specified - PPO | Admitting: Neurology

## 2020-05-21 ENCOUNTER — Emergency Department (HOSPITAL_COMMUNITY): Payer: Federal, State, Local not specified - PPO

## 2020-05-21 ENCOUNTER — Other Ambulatory Visit: Payer: Self-pay

## 2020-05-21 ENCOUNTER — Emergency Department (HOSPITAL_COMMUNITY)
Admission: EM | Admit: 2020-05-21 | Discharge: 2020-05-21 | Disposition: A | Discharge status: Home / Self Care | Payer: Federal, State, Local not specified - PPO | Attending: Emergency Medicine | Admitting: Emergency Medicine

## 2020-05-21 DIAGNOSIS — R109 Unspecified abdominal pain: Secondary | ICD-10-CM | POA: Diagnosis not present

## 2020-05-21 DIAGNOSIS — N200 Calculus of kidney: Secondary | ICD-10-CM

## 2020-05-21 DIAGNOSIS — R231 Pallor: Secondary | ICD-10-CM | POA: Diagnosis not present

## 2020-05-21 DIAGNOSIS — R1084 Generalized abdominal pain: Secondary | ICD-10-CM | POA: Diagnosis not present

## 2020-05-21 DIAGNOSIS — N201 Calculus of ureter: Secondary | ICD-10-CM | POA: Diagnosis not present

## 2020-05-21 DIAGNOSIS — I10 Essential (primary) hypertension: Secondary | ICD-10-CM | POA: Diagnosis not present

## 2020-05-21 DIAGNOSIS — Z79899 Other long term (current) drug therapy: Secondary | ICD-10-CM | POA: Insufficient documentation

## 2020-05-21 DIAGNOSIS — R0902 Hypoxemia: Secondary | ICD-10-CM | POA: Diagnosis not present

## 2020-05-21 DIAGNOSIS — I700 Atherosclerosis of aorta: Secondary | ICD-10-CM | POA: Diagnosis not present

## 2020-05-21 DIAGNOSIS — Z9049 Acquired absence of other specified parts of digestive tract: Secondary | ICD-10-CM | POA: Diagnosis not present

## 2020-05-21 DIAGNOSIS — R52 Pain, unspecified: Secondary | ICD-10-CM | POA: Diagnosis not present

## 2020-05-21 DIAGNOSIS — N134 Hydroureter: Secondary | ICD-10-CM | POA: Diagnosis not present

## 2020-05-21 LAB — CBC WITH DIFFERENTIAL/PLATELET
Abs Immature Granulocytes: 0.05 10*3/uL (ref 0.00–0.07)
Basophils Absolute: 0.1 10*3/uL (ref 0.0–0.1)
Basophils Relative: 1 %
Eosinophils Absolute: 0.2 10*3/uL (ref 0.0–0.5)
Eosinophils Relative: 2 %
HCT: 45.6 % (ref 39.0–52.0)
Hemoglobin: 15.4 g/dL (ref 13.0–17.0)
Immature Granulocytes: 1 %
Lymphocytes Relative: 13 %
Lymphs Abs: 1.4 10*3/uL (ref 0.7–4.0)
MCH: 28.8 pg (ref 26.0–34.0)
MCHC: 33.8 g/dL (ref 30.0–36.0)
MCV: 85.4 fL (ref 80.0–100.0)
Monocytes Absolute: 0.8 10*3/uL (ref 0.1–1.0)
Monocytes Relative: 7 %
Neutro Abs: 8.3 10*3/uL — ABNORMAL HIGH (ref 1.7–7.7)
Neutrophils Relative %: 76 %
Platelets: 325 10*3/uL (ref 150–400)
RBC: 5.34 MIL/uL (ref 4.22–5.81)
RDW: 13.2 % (ref 11.5–15.5)
WBC: 10.8 10*3/uL — ABNORMAL HIGH (ref 4.0–10.5)
nRBC: 0 % (ref 0.0–0.2)

## 2020-05-21 LAB — URINALYSIS, ROUTINE W REFLEX MICROSCOPIC
Bilirubin Urine: NEGATIVE
Glucose, UA: NEGATIVE mg/dL
Ketones, ur: NEGATIVE mg/dL
Leukocytes,Ua: NEGATIVE
Nitrite: NEGATIVE
Protein, ur: NEGATIVE mg/dL
RBC / HPF: >50 RBC/hpf — ABNORMAL HIGH (ref 0–5)
Specific Gravity, Urine: 1.011 (ref 1.005–1.030)
pH: 7 (ref 5.0–8.0)

## 2020-05-21 LAB — BASIC METABOLIC PANEL
Anion gap: 11 (ref 5–15)
BUN: 11 mg/dL (ref 6–20)
CO2: 25 mmol/L (ref 22–32)
Calcium: 9.4 mg/dL (ref 8.9–10.3)
Chloride: 102 mmol/L (ref 98–111)
Creatinine, Ser: 1.1 mg/dL (ref 0.61–1.24)
GFR, Estimated: >60 mL/min (ref >60)
Glucose, Bld: 114 mg/dL — ABNORMAL HIGH (ref 70–99)
Potassium: 3 mmol/L — ABNORMAL LOW (ref 3.5–5.1)
Sodium: 138 mmol/L (ref 135–145)

## 2020-05-21 MED ORDER — ONDANSETRON HCL 4 MG/2ML IJ SOLN
4.0000 mg | Freq: Once | INTRAMUSCULAR | Status: AC
  Administered 2020-05-21: 4 mg via INTRAVENOUS
  Filled 2020-05-21: qty 2

## 2020-05-21 MED ORDER — MORPHINE SULFATE (PF) 4 MG/ML IV SOLN
4.0000 mg | Freq: Once | INTRAVENOUS | Status: AC
  Administered 2020-05-21: 4 mg via INTRAVENOUS
  Filled 2020-05-21: qty 1

## 2020-05-21 MED ORDER — KETOROLAC TROMETHAMINE 30 MG/ML IJ SOLN
30.0000 mg | Freq: Once | INTRAMUSCULAR | Status: AC
  Administered 2020-05-21: 30 mg via INTRAVENOUS
  Filled 2020-05-21: qty 1

## 2020-05-21 MED ORDER — ONDANSETRON 4 MG PO TBDP
4.0000 mg | ORAL_TABLET | Freq: Three times a day (TID) | ORAL | 0 refills | Status: DC | PRN
Start: 2020-05-21 — End: 2021-04-12

## 2020-05-21 MED ORDER — SODIUM CHLORIDE 0.9 % IV BOLUS
1000.0000 mL | Freq: Once | INTRAVENOUS | Status: AC
  Administered 2020-05-21: 1000 mL via INTRAVENOUS

## 2020-05-21 MED ORDER — HYDROCODONE-ACETAMINOPHEN 5-325 MG PO TABS: 2.0000 | ORAL_TABLET | Freq: Four times a day (QID) | ORAL | 0 refills | Status: DC | PRN

## 2020-05-21 NOTE — ED Triage Notes (Signed)
Patient BIB EMS from home c/o abdominal pain this morning. Pt stated waking up this morning with LRQ abdominal pain radiating to his groin.   Patient denies Fever, N/V/D. Patient a/o x4, ambulatory.

## 2020-05-21 NOTE — ED Provider Notes (Signed)
[facility]-EMERGENCY DEPT Provider Note   CSN: [id number] Arrival date & time: 05/21/20  0115     History Chief Complaint  Patient presents with  . Abdominal Pain    [name] is a 60 y.o. male.  HPI     This is 60 year old male with a history of hypertension who presents with right-sided flank and abdominal pain.  Patient reports pain woke him up from sleep.  He states "it felt like I got kicked in the groin."  He states that he had a "twinge" of back pain yesterday evening but otherwise is felt well.  He states he felt very nauseated when the pain started like he might pass out.  No chest pain or shortness of breath.  No fevers.  No hematuria or dysuria.  No known history of kidney stones.  He states the pain has waxed and waned.  He has not taken anything for the pain.  Currently his pain is 4 out of 10.  Past Medical History:  Diagnosis Date  . Hypertension     There are no problems to display for this patient.   Past Surgical History:  Procedure Laterality Date  . CHOLECYSTECTOMY         Family History  Problem Relation Age of Onset  . Healthy Mother   . Diabetes Father   . Hypotension Father     Social History   Tobacco Use  . Smoking status: Never Smoker  . Smokeless tobacco: Never Used  Vaping Use  . Vaping Use: Never used  Substance Use Topics  . Alcohol use: Never  . Drug use: Never    Home Medications Prior to Admission medications   Medication Sig Start Date End Date Taking? Authorizing Provider  HYDROcodone-acetaminophen (NORCO/VICODIN) 5-325 MG tablet Take 2 tablets by mouth every 6 (six) hours as needed. 05/21/20  Yes Darielys Giglia, Mayer Masker, MD  ondansetron (ZOFRAN ODT) 4 MG disintegrating tablet Take 1 tablet (4 mg total) by mouth every 8 (eight) hours as needed for nausea or vomiting. 05/21/20  Yes Ajeet Casasola, Mayer Masker, MD  buPROPion (WELLBUTRIN XL) 300 MG 24 hr tablet Take 300 mg by mouth daily. 05/01/19   [provider]  cyclobenzaprine (FLEXERIL) 10 MG tablet Take 1 tablet (10 mg total) by mouth at bedtime. 08/03/19   Wurst, [place], PA-C  gabapentin (NEURONTIN) 300 MG capsule Take 1 capsule (300 mg total) by mouth at bedtime. 03/29/20   York Spaniel, MD  hydrochlorothiazide (HYDRODIURIL) 25 MG tablet Take 1 tablet (25 mg total) by mouth daily. 08/18/19 10/17/19  Moshe Cipro, NP  hydrochlorothiazide (HYDRODIURIL) 25 MG tablet Take 1 tablet (25 mg total) by mouth daily. Patient taking differently: Take 12.5 mg by mouth daily.  08/18/19   Moshe Cipro, NP  ibuprofen (ADVIL,MOTRIN) 200 MG tablet Take 400-600 mg by mouth daily as needed for mild pain or moderate pain.    [provider]  losartan (COZAAR) 100 MG tablet Take 100 mg by mouth daily.    [provider]  potassium chloride SA (KLOR-CON) 20 MEQ tablet Take 1 tablet (20 mEq total) by mouth 2 (two) times daily. 08/27/19   Triplett, Tammy, PA-C  predniSONE (STERAPRED UNI-PAK 21 TAB) 10 MG (21) TBPK tablet Take by mouth daily. Take 6 tabs by mouth daily  for 2 days, then 5 tabs for 2 days, then 4 tabs for 2 days, then 3 tabs for 2 days, 2 tabs for 2 days, then 1 tab by  mouth daily for 2 days 08/03/19   Alvino Chapel [place], PA-C    Allergies    Patient has no known allergies.  Review of Systems   Review of Systems  Constitutional: Negative for fever.  Respiratory: Negative for shortness of breath.   Cardiovascular: Negative for chest pain.  Gastrointestinal: Positive for nausea. Negative for abdominal pain and vomiting.  Genitourinary: Positive for flank pain. Negative for dysuria, hematuria, penile swelling and scrotal swelling.  All other systems reviewed and are negative.   Physical Exam Updated Vital Signs BP (!) 164/98 (BP Location: Left Arm)   Pulse 84   Temp 97.9 F (36.6 C) (Oral)   Resp 18   Ht 1.778 m (5\' 10" )   Wt 108.9 kg   SpO2 96%   BMI 34.44 kg/m   Physical Exam Vitals and nursing  note reviewed.  Constitutional:      Appearance: He is well-developed and well-nourished. He is obese. He is not ill-appearing.  HENT:     Head: Normocephalic and atraumatic.  Eyes:     Pupils: Pupils are equal, round, and reactive to light.  Cardiovascular:     Rate and Rhythm: Normal rate and regular rhythm.     Heart sounds: Normal heart sounds. No murmur heard.   Pulmonary:     Effort: Pulmonary effort is normal. No respiratory distress.     Breath sounds: Normal breath sounds. No wheezing.  Abdominal:     General: Bowel sounds are normal.     Palpations: Abdomen is soft.     Tenderness: There is no abdominal tenderness. There is no right CVA tenderness, left CVA tenderness, guarding or rebound.  Musculoskeletal:        General: No edema.     Cervical back: Neck supple.  Lymphadenopathy:     Cervical: No cervical adenopathy.  Skin:    General: Skin is warm and dry.  Neurological:     Mental Status: He is alert and oriented to person, place, and time.  Psychiatric:        Mood and Affect: Mood and affect and mood normal.     ED Results / Procedures / Treatments   Labs (all labs ordered are listed, but only abnormal results are displayed) Labs Reviewed  CBC WITH DIFFERENTIAL/PLATELET - Abnormal; Notable for the following components:      Result Value   WBC 10.8 (*)    Neutro Abs 8.3 (*)    All other components within normal limits  BASIC METABOLIC PANEL - Abnormal; Notable for the following components:   Potassium 3.0 (*)    Glucose, Bld 114 (*)    All other components within normal limits  URINALYSIS, ROUTINE W REFLEX MICROSCOPIC - Abnormal; Notable for the following components:   APPearance HAZY (*)    Hgb urine dipstick MODERATE (*)    RBC / HPF >50 (*)    Bacteria, UA RARE (*)    All other components within normal limits    EKG None  Radiology CT Renal Stone Study  Result Date: 05/21/2020 CLINICAL DATA:  Flank pain, kidney stone suspected. waking up  this morning with LRQ abdominal pain radiating to his groin. Patient denies Fever, N/V/D. Patient a/o x4, ambulatory. EXAM: CT ABDOMEN AND PELVIS WITHOUT CONTRAST TECHNIQUE: Multidetector CT imaging of the abdomen and pelvis was performed following the standard protocol without IV contrast. COMPARISON:  None. FINDINGS: Lower chest: No acute abnormality. Hepatobiliary: There is a 3.2 cm fluid density lesion within the right hepatic lobe  that likely represents a simple hepatic cyst. Subcentimeter hypodensities are too small to characterize. No focal liver abnormality. Status post cholecystectomy. No biliary dilatation. Pancreas: No focal lesion. Normal pancreatic contour. No surrounding inflammatory changes. No main pancreatic ductal dilatation. Spleen: Normal in size without focal abnormality. Adrenals/Urinary Tract: No adrenal nodule bilaterally. No nephrolithiasis, no hydronephrosis, and no contour-deforming renal mass. No ureterolithiasis. No left hydroureter. Mild right hydroureter with an associated 2 mm right ureterovesicular junction stone. The urinary bladder is unremarkable. Stomach/Bowel: Stomach is within normal limits. No evidence of bowel wall thickening or dilatation. Appendix appears normal. Vascular/Lymphatic: No abdominal aorta or iliac aneurysm. Mild atherosclerotic plaque of the aorta and its branches. No abdominal, pelvic, or inguinal lymphadenopathy. Reproductive: Prostate is unremarkable. Other: No intraperitoneal free fluid. No intraperitoneal free gas. No organized fluid collection. Musculoskeletal: No abdominal wall hernia or abnormality. No suspicious lytic or blastic osseous lesions. No acute displaced fracture. Multilevel degenerative changes of the spine. IMPRESSION: 1. Obstructive 2 mm right ureterovesicular junction stone. 2.  Aortic Atherosclerosis (ICD10-I70.0). Electronically Signed   By: Tish Frederickson M.D.   On: 05/21/2020 02:19    Procedures Procedures   Medications Ordered  in ED Medications  ketorolac (TORADOL) 30 MG/ML injection 30 mg (has no administration in time range)  morphine 4 MG/ML injection 4 mg (4 mg Intravenous Given 05/21/20 0211)  ondansetron (ZOFRAN) injection 4 mg (4 mg Intravenous Given 05/21/20 0210)  sodium chloride 0.9 % bolus 1,000 mL (1,000 mLs Intravenous New Bag/Given 05/21/20 0214)    ED Course  I have reviewed the triage vital signs and the nursing notes.  Pertinent labs & imaging results that were available during my care of the patient were reviewed by me and considered in my medical decision making (see chart for details).    MDM Rules/Calculators/A&P                          Patient presents with acute onset of right-sided abdominal and groin pain.  He is overall nontoxic and vital signs are reassuring.  He is afebrile.  History highly suspicious of kidney stones.  No prior history.  Other considerations include but not limited to UTI, appendicitis.  Patient was given pain and nausea medication as well as fluids.  Lab work obtained.  No AKI.  He has greater than 50 red cells in his urine but no evidence of UTI.  Stone study obtained and shows a 2 mm UVJ stone.  Patient is comfortable on recheck.  He was dosed Toradol.  We discussed expectant management and urology follow-up as needed.  After history, exam, and medical workup I feel the patient has been appropriately medically screened and is safe for discharge home. Pertinent diagnoses were discussed with the patient. Patient was given return precautions.    Final Clinical Impression(s) / ED Diagnoses Final diagnoses:  Kidney stone    Rx / DC Orders ED Discharge Orders         Ordered    HYDROcodone-acetaminophen (NORCO/VICODIN) 5-325 MG tablet  Every 6 hours PRN        05/21/20 0317    ondansetron (ZOFRAN ODT) 4 MG disintegrating tablet  Every 8 hours PRN        05/21/20 0317           Shon Baton, MD 05/21/20 [phone]

## 2020-05-21 NOTE — Discharge Instructions (Addendum)
You were found today to have kidney stones.  This will likely pass on its own.  Take medications as prescribed.  Make sure that you are staying hydrated.  Follow-up with urology as needed.

## 2020-05-23 ENCOUNTER — Ambulatory Visit
Admission: EM | Admit: 2020-05-23 | Discharge: 2020-05-23 | Disposition: A | Discharge status: Home / Self Care | Payer: Federal, State, Local not specified - PPO | Attending: Family Medicine | Admitting: Family Medicine

## 2020-05-23 ENCOUNTER — Other Ambulatory Visit: Payer: Self-pay

## 2020-05-23 ENCOUNTER — Encounter: Payer: Self-pay | Admitting: Emergency Medicine

## 2020-05-23 DIAGNOSIS — R509 Fever, unspecified: Secondary | ICD-10-CM | POA: Diagnosis not present

## 2020-05-23 DIAGNOSIS — R300 Dysuria: Secondary | ICD-10-CM | POA: Diagnosis not present

## 2020-05-23 DIAGNOSIS — N3001 Acute cystitis with hematuria: Secondary | ICD-10-CM | POA: Insufficient documentation

## 2020-05-23 DIAGNOSIS — R3129 Other microscopic hematuria: Secondary | ICD-10-CM | POA: Insufficient documentation

## 2020-05-23 DIAGNOSIS — R109 Unspecified abdominal pain: Secondary | ICD-10-CM | POA: Insufficient documentation

## 2020-05-23 LAB — POCT URINALYSIS DIP (MANUAL ENTRY)
Bilirubin, UA: NEGATIVE
Glucose, UA: NEGATIVE mg/dL
Ketones, POC UA: NEGATIVE mg/dL
Leukocytes, UA: NEGATIVE
Nitrite, UA: NEGATIVE
Protein Ur, POC: NEGATIVE mg/dL
Spec Grav, UA: 1.015 (ref 1.010–1.025)
Urobilinogen, UA: 0.2 E.U./dL
pH, UA: 7.5 (ref 5.0–8.0)

## 2020-05-23 MED ORDER — TAMSULOSIN HCL 0.4 MG PO CAPS: 0.4000 mg | ORAL_CAPSULE | Freq: Every day | ORAL | 0 refills | Status: DC

## 2020-05-23 MED ORDER — PHENAZOPYRIDINE HCL 200 MG PO TABS
200.0000 mg | ORAL_TABLET | Freq: Three times a day (TID) | ORAL | 0 refills | Status: DC
Start: 2020-05-23 — End: 2021-04-12

## 2020-05-23 MED ORDER — CIPROFLOXACIN HCL 500 MG PO TABS
500.0000 mg | ORAL_TABLET | Freq: Two times a day (BID) | ORAL | 0 refills | Status: DC
Start: 2020-05-23 — End: 2021-04-12

## 2020-05-23 NOTE — ED Triage Notes (Signed)
Went to ER on Sunday and was diagnosed with kidney stone.  Today has fever lower right back pain, urinary frequency.

## 2020-05-23 NOTE — Discharge Instructions (Addendum)
I have sent in Cipro for you to use twice daily for 7 days  I have sent in flomax for you to take as well daily  You may take the pyridium three times per day as needed for urinary discomfort  You may have a urinary tract infection.   We are going to culture your urine and will call you as soon as we have the results.   Drink plenty of water, 8-10 glasses per day.   Follow up with your primary care provider as needed.   Go to the Emergency Department if you experience severe pain, shortness of breath, high fever, or other concerns.

## 2020-05-23 NOTE — ED Provider Notes (Signed)
[facility]   CC: UTI  SUBJECTIVE:  [name] is a 60 y.o. male who complains of urinary frequency, urgency and dysuria for the past 3 days. Reports that he was seen in the ER on 05/22/20 for flank pain and was diagnosed with a kidney stone. Reports that he has since had worsening dysuria, urinary urgency and frequency.  Reports that he is also since developed a fever and fatigue.  Pain is intermittent and describes it as sharp. Has not tried OTC medications without relief. Symptoms are made worse with urination.  Denies similar symptoms in the past. Denies nausea, vomiting, abdominal pain    ROS: As in HPI.  All other pertinent ROS negative.     Past Medical History:  Diagnosis Date  . Hypertension    Past Surgical History:  Procedure Laterality Date  . CHOLECYSTECTOMY     No Known Allergies No current facility-administered medications on file prior to encounter.   Current Outpatient Medications on File Prior to Encounter  Medication Sig Dispense Refill  . buPROPion (WELLBUTRIN XL) 300 MG 24 hr tablet Take 300 mg by mouth daily.    . cyclobenzaprine (FLEXERIL) 10 MG tablet Take 1 tablet (10 mg total) by mouth at bedtime. 15 tablet 0  . gabapentin (NEURONTIN) 300 MG capsule Take 1 capsule (300 mg total) by mouth at bedtime. 30 capsule 3  . hydrochlorothiazide (HYDRODIURIL) 25 MG tablet Take 1 tablet (25 mg total) by mouth daily. 30 tablet 1  . hydrochlorothiazide (HYDRODIURIL) 25 MG tablet Take 1 tablet (25 mg total) by mouth daily. (Patient taking differently: Take 12.5 mg by mouth daily. ) 30 tablet 1  . HYDROcodone-acetaminophen (NORCO/VICODIN) 5-325 MG tablet Take 2 tablets by mouth every 6 (six) hours as needed. 10 tablet 0  . ibuprofen (ADVIL,MOTRIN) 200 MG tablet Take 400-600 mg by mouth daily as needed for mild pain or moderate pain.    [name] Kitchen losartan (COZAAR) 100 MG tablet Take 100 mg by mouth daily.    . ondansetron (ZOFRAN ODT) 4 MG disintegrating tablet Take  1 tablet (4 mg total) by mouth every 8 (eight) hours as needed for nausea or vomiting. 20 tablet 0  . potassium chloride SA (KLOR-CON) 20 MEQ tablet Take 1 tablet (20 mEq total) by mouth 2 (two) times daily. 14 tablet 0  . predniSONE (STERAPRED UNI-PAK 21 TAB) 10 MG (21) TBPK tablet Take by mouth daily. Take 6 tabs by mouth daily  for 2 days, then 5 tabs for 2 days, then 4 tabs for 2 days, then 3 tabs for 2 days, 2 tabs for 2 days, then 1 tab by mouth daily for 2 days 42 tablet 0   Social History   Socioeconomic History  . Marital status: Single    Spouse name: Not on file  . Number of children: Not on file  . Years of education: Not on file  . Highest education level: Not on file  Occupational History  . Occupation: Full Time  Tobacco Use  . Smoking status: Never Smoker  . Smokeless tobacco: Never Used  Vaping Use  . Vaping Use: Never used  Substance and Sexual Activity  . Alcohol use: Never  . Drug use: Never  . Sexual activity: Not on file  Other Topics Concern  . Not on file  Social History Narrative   Lives with son   Right handed   Drinks 1-2 cups caffeine daily   Social Determinants of Health   Financial Resource Strain: Not  on file  Food Insecurity: Not on file  Transportation Needs: Not on file  Physical Activity: Not on file  Stress: Not on file  Social Connections: Not on file  Intimate Partner Violence: Not on file   Family History  Problem Relation Age of Onset  . Healthy Mother   . Diabetes Father   . Hypotension Father     OBJECTIVE:  Vitals:   05/23/20 0812 05/23/20 0813  BP: (S) (!) 179/103   Pulse: 93   Resp: 16   Temp: 98 F (36.7 C)   TempSrc: Oral   SpO2: 94%   Weight:  235 lb (106.6 kg)  Height:  5\' 10"  (1.778 m)   General appearance: AOx3 in no acute distress HEENT: NCAT. Oropharynx clear.  Lungs: clear to auscultation bilaterally without adventitious breath sounds Heart: regular rate and rhythm. Radial pulses 2+ symmetrical  bilaterally Abdomen: soft; non-distended; no tenderness; bowel sounds present; no guarding or rebound tenderness Back: CVA tenderness present Extremities: no edema; symmetrical with no gross deformities Skin: warm and dry Neurologic: Ambulates from chair to exam table without difficulty Psychological: alert and cooperative; normal mood and affect  Labs Reviewed  POCT URINALYSIS DIP (MANUAL ENTRY) - Abnormal; Notable for the following components:      Result Value   Blood, UA small (*)    All other components within normal limits  URINE CULTURE    ASSESSMENT & PLAN:  1. Acute cystitis with hematuria   2. Dysuria   3. Fever, unspecified fever cause   4. Other microscopic hematuria   5. Right flank pain     Meds ordered this encounter  Medications  . ciprofloxacin (CIPRO) 500 MG tablet    Sig: Take 1 tablet (500 mg total) by mouth 2 (two) times daily.    Dispense:  14 tablet    Refill:  0    Order Specific Question:   Supervising Provider    Answer:   Merrilee Jansky  . tamsulosin (FLOMAX) 0.4 MG CAPS capsule    Sig: Take 1 capsule (0.4 mg total) by mouth daily.    Dispense:  30 capsule    Refill:  0    Order Specific Question:   Supervising Provider    Answer:   [id number] Merrilee Jansky  . phenazopyridine (PYRIDIUM) 200 MG tablet    Sig: Take 1 tablet (200 mg total) by mouth 3 (three) times daily.    Dispense:  6 tablet    Refill:  0    Order Specific Question:   Supervising Provider    Answer:   [id number] Merrilee Jansky    UA positive for blood in the office Prescribe Cipro given symptoms and fever Prescribed Flomax Prescribed Pyridium Urine culture sent  We will call you with abnormal results that need further treatment Push fluids and get plenty of rest Take antibiotic as directed and to completion Take pyridium as prescribed and as needed for symptomatic relief Follow up with PCP if symptoms persists Return here or go to ER if you have any  new or worsening symptoms such as fever, worsening abdominal pain, nausea/vomiting, flank pain  Outlined signs and symptoms indicating need for more acute intervention Patient verbalized understanding After Visit Summary given     [[id number]], NP 05/23/20 1431

## 2020-05-26 ENCOUNTER — Telehealth (HOSPITAL_COMMUNITY): Payer: Self-pay | Admitting: Emergency Medicine

## 2020-05-26 LAB — URINE CULTURE: Culture: >=100000 — AB

## 2020-05-26 MED ORDER — NITROFURANTOIN MONOHYD MACRO 100 MG PO CAPS: 100.0000 mg | ORAL_CAPSULE | Freq: Two times a day (BID) | ORAL | 0 refills | Status: DC

## 2020-06-07 DIAGNOSIS — E6609 Other obesity due to excess calories: Secondary | ICD-10-CM | POA: Diagnosis not present

## 2020-06-07 DIAGNOSIS — N200 Calculus of kidney: Secondary | ICD-10-CM | POA: Diagnosis not present

## 2020-06-07 DIAGNOSIS — Z23 Encounter for immunization: Secondary | ICD-10-CM | POA: Diagnosis not present

## 2020-06-07 DIAGNOSIS — Z1331 Encounter for screening for depression: Secondary | ICD-10-CM | POA: Diagnosis not present

## 2020-06-07 DIAGNOSIS — Z6835 Body mass index (BMI) 35.0-35.9, adult: Secondary | ICD-10-CM | POA: Diagnosis not present

## 2020-09-07 ENCOUNTER — Ambulatory Visit: Payer: Federal, State, Local not specified - PPO | Attending: Internal Medicine

## 2020-09-07 DIAGNOSIS — Z23 Encounter for immunization: Secondary | ICD-10-CM

## 2020-09-07 NOTE — Progress Notes (Signed)
   Covid-19 Vaccination Clinic  Name:  [name]    MRN: [id number] DOB: [birth year]  09/07/2020  [name] was observed post Covid-19 immunization for 15 minutes without incident. He was provided with Vaccine Information Sheet and instruction to access the V-Safe system.   [name] was instructed to call 911 with any severe reactions post vaccine: [name] Kitchen Difficulty breathing  . Swelling of face and throat  . A fast heartbeat  . A bad rash all over body  . Dizziness and weakness   Immunizations Administered    Name Date Dose VIS Date Route   Moderna Covid-19 Booster Vaccine 09/07/2020 10:18 AM 0.25 mL 02/09/2020 Intramuscular   Manufacturer: Moderna   Lot: [id number]   NDC: 75643-329-51

## 2020-09-08 ENCOUNTER — Other Ambulatory Visit (HOSPITAL_BASED_OUTPATIENT_CLINIC_OR_DEPARTMENT_OTHER): Payer: Self-pay

## 2020-09-08 MED ORDER — MODERNA COVID-19 VACCINE 100 MCG/0.5ML IM SUSP
INTRAMUSCULAR | 0 refills | Status: DC
  Filled 2020-09-08: qty 0.25, 1d supply, fill #0

## 2020-09-27 ENCOUNTER — Ambulatory Visit: Payer: Federal, State, Local not specified - PPO | Admitting: Neurology

## 2020-11-08 DIAGNOSIS — J019 Acute sinusitis, unspecified: Secondary | ICD-10-CM | POA: Diagnosis not present

## 2021-02-05 DIAGNOSIS — Z20822 Contact with and (suspected) exposure to covid-19: Secondary | ICD-10-CM | POA: Diagnosis not present

## 2021-02-15 IMAGING — CT CT RENAL STONE PROTOCOL
2 of 4 series · 16 of 46 positions shown, 18 images · non-contrast
Comparison: None.

CLINICAL DATA: Flank pain, kidney stone suspected. waking up this
morning with LRQ abdominal pain radiating to his groin. Patient
denies Fever, N/V/D. Patient a/o x4, ambulatory.

EXAM:
CT ABDOMEN AND PELVIS WITHOUT CONTRAST
TECHNIQUE: Multidetector CT imaging of the abdomen and pelvis was performed
following the standard protocol without IV contrast.

[Series 2: axial st · axial · 0.95mm/px · z∈[-512,-96]mm · 13 of 95 slices shown, 15 images]
[im 6/95  soft-tissue]
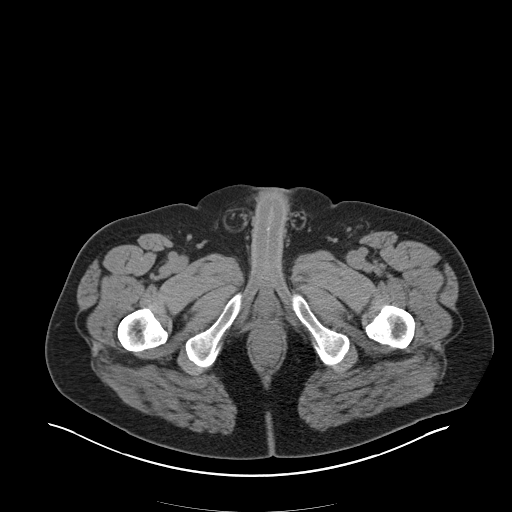
[im 6/95  bone]
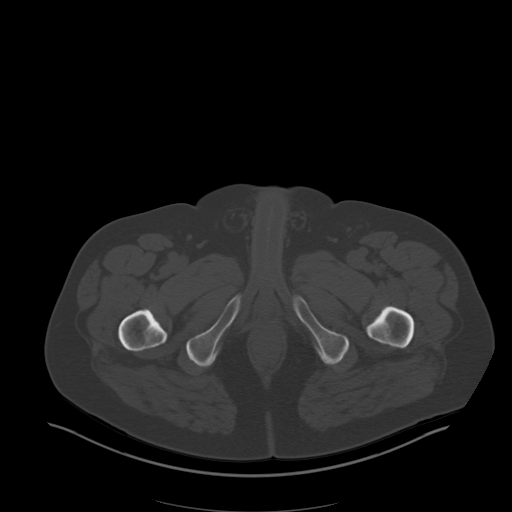
[im 12/95  soft-tissue]
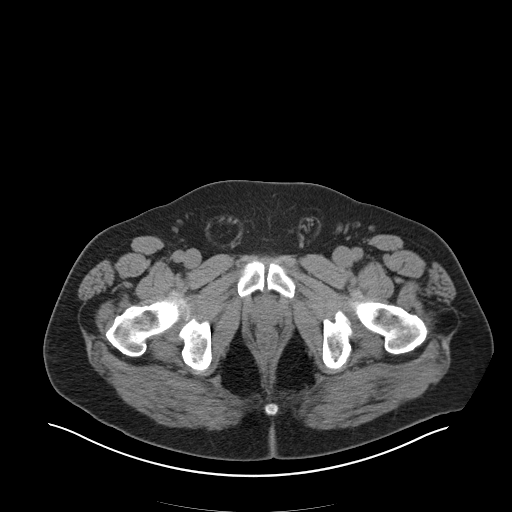
[im 23/95  soft-tissue]
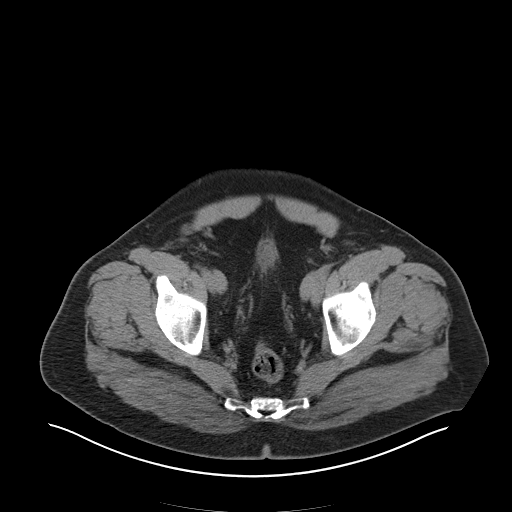
[im 28/95  soft-tissue]
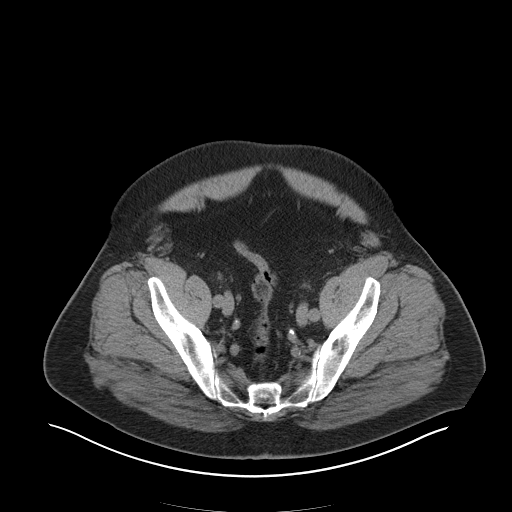
[im 34/95  soft-tissue]
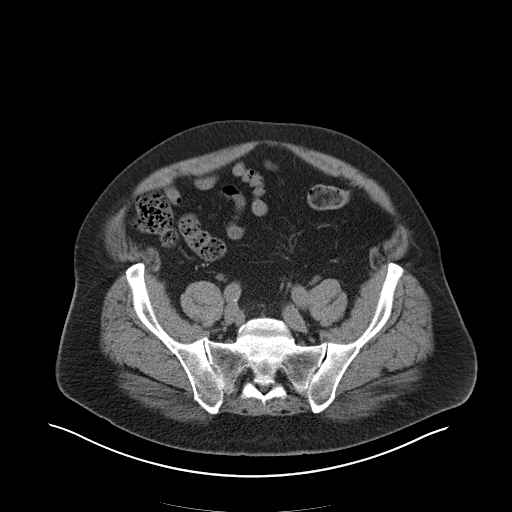
[im 39/95  soft-tissue]
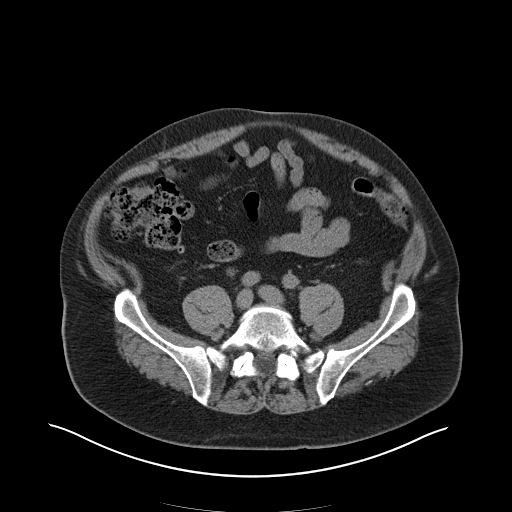
[im 50/95  soft-tissue]
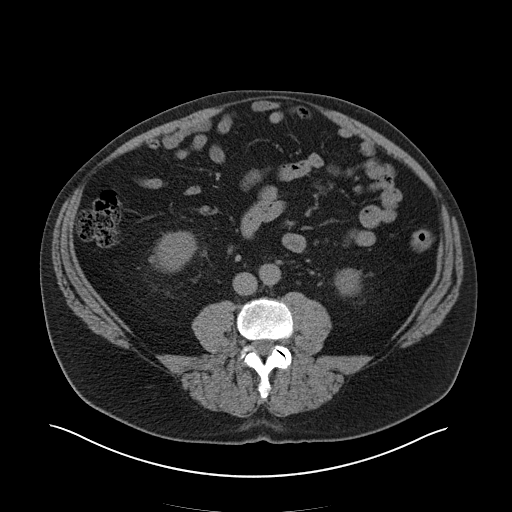
[im 56/95  soft-tissue]
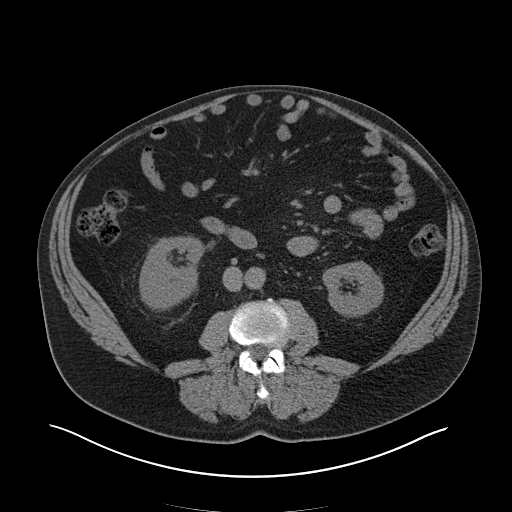
[im 61/95  soft-tissue]
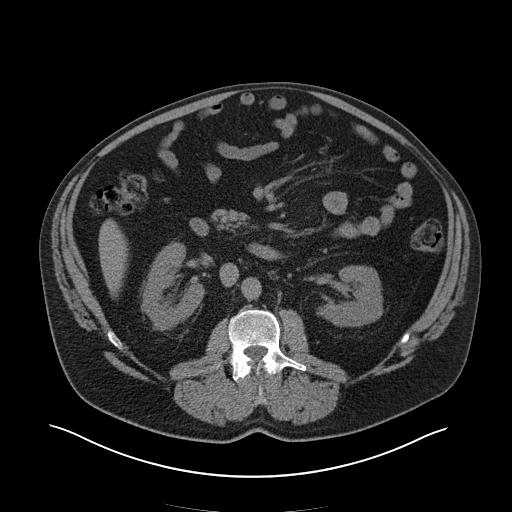
[im 61/95  bone]
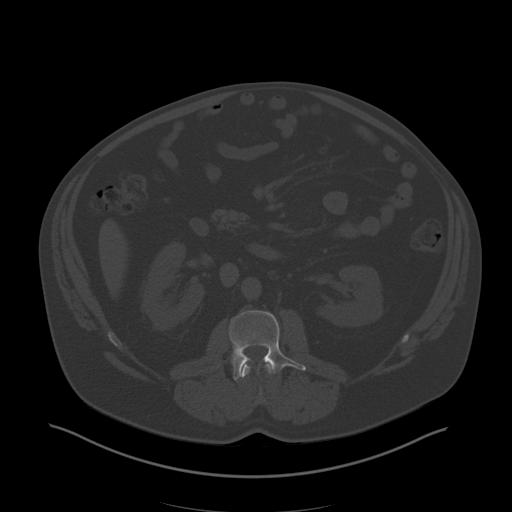
[im 67/95  soft-tissue]
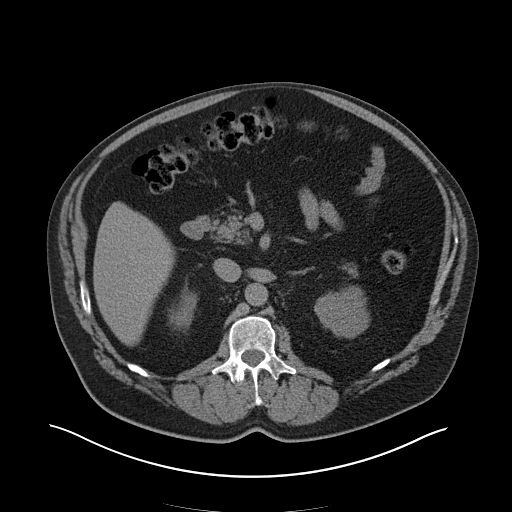
[im 72/95  soft-tissue]
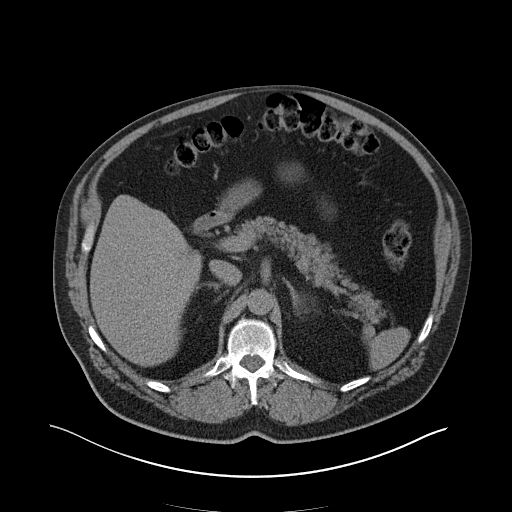
[im 83/95  soft-tissue]
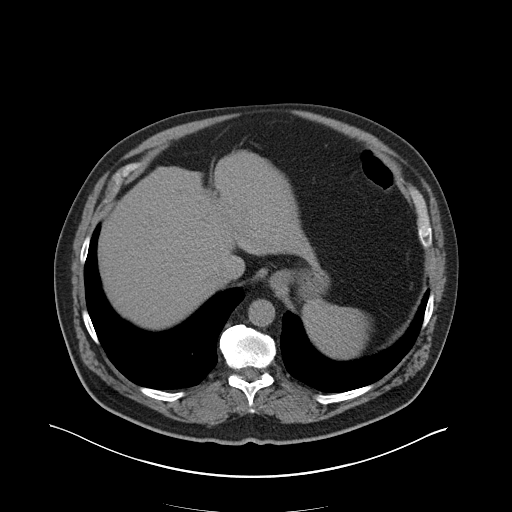
[im 89/95  soft-tissue]
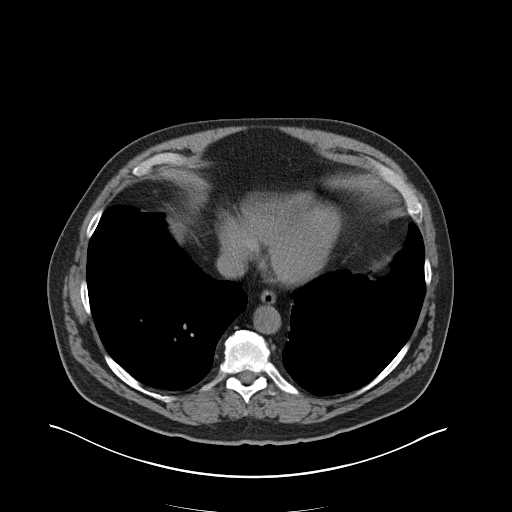

[Series 5: coronal · coronal · 0.92mm/px · 3 of 183 slices shown]
[im 61/183  soft-tissue]
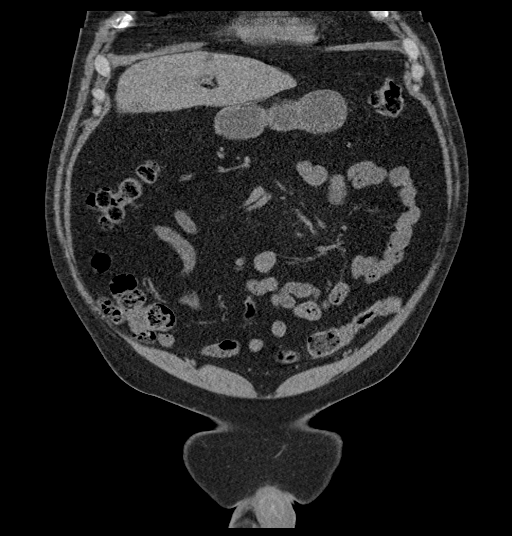
[im 81/183  soft-tissue]
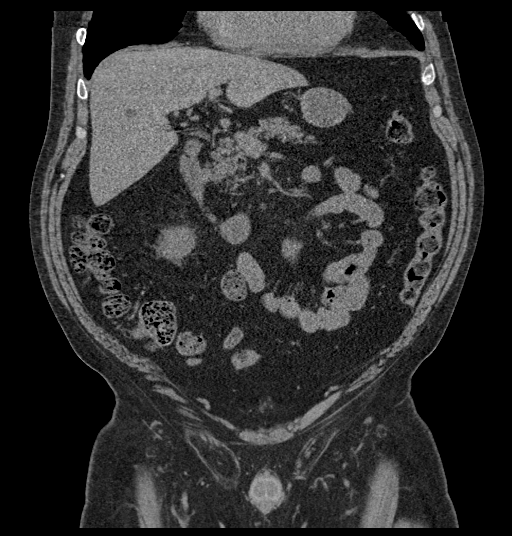
[im 102/183  soft-tissue]
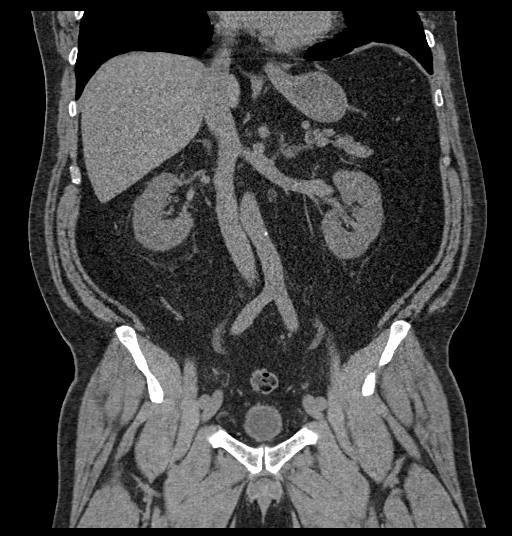

[16 of 46 positions shown; findings below may reference images not displayed]

FINDINGS: Lower chest: No acute abnormality.

Hepatobiliary: There is a 3.2 cm fluid density lesion within the
right hepatic lobe that likely represents a simple hepatic cyst.
Subcentimeter hypodensities are too small to characterize. No focal
liver abnormality. Status post cholecystectomy. No biliary
dilatation.

Pancreas: No focal lesion. Normal pancreatic contour. No surrounding
inflammatory changes. No main pancreatic ductal dilatation.

Spleen: Normal in size without focal abnormality.

Adrenals/Urinary Tract: No adrenal nodule bilaterally. No
nephrolithiasis, no hydronephrosis, and no contour-deforming renal
mass. No ureterolithiasis. No left hydroureter. Mild right
hydroureter with an associated 2 mm right ureterovesicular junction
stone. The urinary bladder is unremarkable.

Stomach/Bowel: Stomach is within normal limits. No evidence of bowel
wall thickening or dilatation. Appendix appears normal.

Vascular/Lymphatic: No abdominal aorta or iliac aneurysm. Mild
atherosclerotic plaque of the aorta and its branches. No abdominal,
pelvic, or inguinal lymphadenopathy.

Reproductive: Prostate is unremarkable.

Other: No intraperitoneal free fluid. No intraperitoneal free gas.
No organized fluid collection.

Musculoskeletal:

No abdominal wall hernia or abnormality.

No suspicious lytic or blastic osseous lesions. No acute displaced
fracture. Multilevel degenerative changes of the spine.
IMPRESSION: 1. Obstructive 2 mm right ureterovesicular junction stone.
2.  Aortic Atherosclerosis ([id number].A).

## 2021-03-27 DIAGNOSIS — G43B0 Ophthalmoplegic migraine, not intractable: Secondary | ICD-10-CM | POA: Diagnosis not present

## 2021-03-27 DIAGNOSIS — I10 Essential (primary) hypertension: Secondary | ICD-10-CM | POA: Diagnosis not present

## 2021-03-27 DIAGNOSIS — Z6836 Body mass index (BMI) 36.0-36.9, adult: Secondary | ICD-10-CM | POA: Diagnosis not present

## 2021-03-27 DIAGNOSIS — E782 Mixed hyperlipidemia: Secondary | ICD-10-CM | POA: Diagnosis not present

## 2021-03-27 DIAGNOSIS — E559 Vitamin D deficiency, unspecified: Secondary | ICD-10-CM | POA: Diagnosis not present

## 2021-03-27 DIAGNOSIS — R42 Dizziness and giddiness: Secondary | ICD-10-CM | POA: Diagnosis not present

## 2021-03-27 DIAGNOSIS — E6609 Other obesity due to excess calories: Secondary | ICD-10-CM | POA: Diagnosis not present

## 2021-03-27 DIAGNOSIS — Z1389 Encounter for screening for other disorder: Secondary | ICD-10-CM | POA: Diagnosis not present

## 2021-03-27 DIAGNOSIS — G4730 Sleep apnea, unspecified: Secondary | ICD-10-CM | POA: Diagnosis not present

## 2021-04-02 DIAGNOSIS — G43B0 Ophthalmoplegic migraine, not intractable: Secondary | ICD-10-CM | POA: Diagnosis not present

## 2021-04-02 DIAGNOSIS — E7849 Other hyperlipidemia: Secondary | ICD-10-CM | POA: Diagnosis not present

## 2021-04-02 DIAGNOSIS — I10 Essential (primary) hypertension: Secondary | ICD-10-CM | POA: Diagnosis not present

## 2021-04-02 DIAGNOSIS — E559 Vitamin D deficiency, unspecified: Secondary | ICD-10-CM | POA: Diagnosis not present

## 2021-04-02 DIAGNOSIS — G4730 Sleep apnea, unspecified: Secondary | ICD-10-CM | POA: Diagnosis not present

## 2021-04-04 DIAGNOSIS — G4730 Sleep apnea, unspecified: Secondary | ICD-10-CM | POA: Diagnosis not present

## 2021-04-04 DIAGNOSIS — R42 Dizziness and giddiness: Secondary | ICD-10-CM | POA: Diagnosis not present

## 2021-04-04 DIAGNOSIS — R7309 Other abnormal glucose: Secondary | ICD-10-CM | POA: Diagnosis not present

## 2021-04-04 DIAGNOSIS — M542 Cervicalgia: Secondary | ICD-10-CM | POA: Diagnosis not present

## 2021-04-04 DIAGNOSIS — R350 Frequency of micturition: Secondary | ICD-10-CM | POA: Diagnosis not present

## 2021-04-04 DIAGNOSIS — E782 Mixed hyperlipidemia: Secondary | ICD-10-CM | POA: Diagnosis not present

## 2021-04-04 DIAGNOSIS — E669 Obesity, unspecified: Secondary | ICD-10-CM | POA: Diagnosis not present

## 2021-04-04 DIAGNOSIS — Z6833 Body mass index (BMI) 33.0-33.9, adult: Secondary | ICD-10-CM | POA: Diagnosis not present

## 2021-04-04 DIAGNOSIS — I10 Essential (primary) hypertension: Secondary | ICD-10-CM | POA: Diagnosis not present

## 2021-04-04 DIAGNOSIS — G43B0 Ophthalmoplegic migraine, not intractable: Secondary | ICD-10-CM | POA: Diagnosis not present

## 2021-04-12 ENCOUNTER — Encounter: Payer: Self-pay | Admitting: Student

## 2021-04-12 ENCOUNTER — Other Ambulatory Visit: Payer: Self-pay

## 2021-04-12 ENCOUNTER — Ambulatory Visit: Payer: Federal, State, Local not specified - PPO | Admitting: Student

## 2021-04-12 VITALS — BP 152/88 | HR 79 | Ht 69.0 in | Wt 245.0 lb

## 2021-04-12 DIAGNOSIS — R079 Chest pain, unspecified: Secondary | ICD-10-CM | POA: Diagnosis not present

## 2021-04-12 DIAGNOSIS — I10 Essential (primary) hypertension: Secondary | ICD-10-CM | POA: Diagnosis not present

## 2021-04-12 DIAGNOSIS — R600 Localized edema: Secondary | ICD-10-CM | POA: Diagnosis not present

## 2021-04-12 DIAGNOSIS — E782 Mixed hyperlipidemia: Secondary | ICD-10-CM | POA: Diagnosis not present

## 2021-04-12 DIAGNOSIS — Z23 Encounter for immunization: Secondary | ICD-10-CM

## 2021-04-12 MED ORDER — ROSUVASTATIN CALCIUM 10 MG PO TABS
10.0000 mg | ORAL_TABLET | Freq: Every day | ORAL | 3 refills | Status: DC
Start: 2021-04-12 — End: 2023-05-06

## 2021-04-12 MED ORDER — METOPROLOL TARTRATE 100 MG PO TABS: 100.0000 mg | ORAL_TABLET | ORAL | 0 refills | Status: DC

## 2021-04-12 NOTE — Progress Notes (Signed)
Cardiology Office Note    Date:  04/12/2021   ID:  [name], DOB 1960/06/28, MRN [id number]  PCP:  [facility]  Cardiologist: Dina Rich, MD    Chief Complaint  Patient presents with   Follow-up    Chest pain and lower extremity edema    History of Present Illness:    [name] is a 60 y.o. male with past medical history of hypertension and family history of CAD who presents to the office today for evaluation of intermittent chest pain and lower extremity edema.   He was last examined by Dr. Wyline Mood in 04/2019 as a new patient referral and reported episodes of sharp chest pain which would last for 20 seconds at a time and spontaneously resolved. Symptoms were overall felt to be atypical and ischemic evaluation was not pursued.   In talking with the patient today, he reports not being very active at baseline but does walk around his workplace on a daily basis. He has noticed more shortness of breath with this over the years. Also reports occasional episodes of shooting chest pain at rest but no exertional pain. No recent orthopnea or PND. Has known OSA but previously intolerant to his CPAP as this caused worsening sinus congestion.   He was previously having lower extremity edema but this was in the setting of eating biscuits in the morning then having fast-food in the evening hours as well. His BP was elevated when checked at his PCP's office last week and he has since started eating at home and monitoring his sodium intake more closely. Since doing so, his edema has significantly improved.    Past Medical History:  Diagnosis Date   Hypertension     Past Surgical History:  Procedure Laterality Date   CHOLECYSTECTOMY      Current Medications: Outpatient Medications Prior to Visit  Medication Sig Dispense Refill   buPROPion (WELLBUTRIN XL) 300 MG 24 hr tablet Take 300 mg by mouth daily.     Iron-Vitamin C (VITRON-C PO) Take by mouth.      losartan (COZAAR) 100 MG tablet Take 100 mg by mouth daily.     Magnesium 500 MG CAPS Take by mouth.     NIACIN-INOSITOL PO Take by mouth.     Omega-3 Fatty Acids (FISH OIL) 1000 MG CAPS Take 2,000 mg by mouth.     potassium chloride SA (KLOR-CON) 20 MEQ tablet Take 1 tablet (20 mEq total) by mouth 2 (two) times daily. 14 tablet 0   VITAMIN D, ERGOCALCIFEROL, PO Take by mouth.     ciprofloxacin (CIPRO) 500 MG tablet Take 1 tablet (500 mg total) by mouth 2 (two) times daily. 14 tablet 0   COVID-19 mRNA vaccine, Moderna, (MODERNA COVID-19 VACCINE) 100 MCG/0.5ML injection Inject into the muscle. 0.25 mL 0   cyclobenzaprine (FLEXERIL) 10 MG tablet Take 1 tablet (10 mg total) by mouth at bedtime. (Patient not taking: Reported on 04/12/2021) 15 tablet 0   gabapentin (NEURONTIN) 300 MG capsule Take 1 capsule (300 mg total) by mouth at bedtime. (Patient not taking: Reported on 04/12/2021) 30 capsule 3   hydrochlorothiazide (HYDRODIURIL) 25 MG tablet Take 1 tablet (25 mg total) by mouth daily. (Patient not taking: Reported on 04/12/2021) 30 tablet 1   hydrochlorothiazide (HYDRODIURIL) 25 MG tablet Take 1 tablet (25 mg total) by mouth daily. (Patient not taking: Reported on 04/12/2021) 30 tablet 1   HYDROcodone-acetaminophen (NORCO/VICODIN) 5-325 MG tablet Take 2 tablets by mouth every  6 (six) hours as needed. (Patient not taking: Reported on 04/12/2021) 10 tablet 0   ibuprofen (ADVIL,MOTRIN) 200 MG tablet Take 400-600 mg by mouth daily as needed for mild pain or moderate pain. (Patient not taking: Reported on 04/12/2021)     nitrofurantoin, macrocrystal-monohydrate, (MACROBID) 100 MG capsule Take 1 capsule (100 mg total) by mouth 2 (two) times daily. (Patient not taking: Reported on 04/12/2021) 10 capsule 0   ondansetron (ZOFRAN ODT) 4 MG disintegrating tablet Take 1 tablet (4 mg total) by mouth every 8 (eight) hours as needed for nausea or vomiting. (Patient not taking: Reported on 04/12/2021) 20 tablet 0    phenazopyridine (PYRIDIUM) 200 MG tablet Take 1 tablet (200 mg total) by mouth 3 (three) times daily. (Patient not taking: Reported on 04/12/2021) 6 tablet 0   predniSONE (STERAPRED UNI-PAK 21 TAB) 10 MG (21) TBPK tablet Take by mouth daily. Take 6 tabs by mouth daily  for 2 days, then 5 tabs for 2 days, then 4 tabs for 2 days, then 3 tabs for 2 days, 2 tabs for 2 days, then 1 tab by mouth daily for 2 days (Patient not taking: Reported on 04/12/2021) 42 tablet 0   tamsulosin (FLOMAX) 0.4 MG CAPS capsule Take 1 capsule (0.4 mg total) by mouth daily. (Patient not taking: Reported on 04/12/2021) 30 capsule 0   No facility-administered medications prior to visit.     Allergies:   Patient has no known allergies.   Social History   Socioeconomic History   Marital status: Single    Spouse name: Not on file   Number of children: Not on file   Years of education: Not on file   Highest education level: Not on file  Occupational History   Occupation: Full Time  Tobacco Use   Smoking status: Never   Smokeless tobacco: Never  Vaping Use   Vaping Use: Never used  Substance and Sexual Activity   Alcohol use: Never   Drug use: Never   Sexual activity: Not on file  Other Topics Concern   Not on file  Social History Narrative   Lives with son   Right handed   Drinks 1-2 cups caffeine daily   Social Determinants of Health   Financial Resource Strain: Not on file  Food Insecurity: Not on file  Transportation Needs: Not on file  Physical Activity: Not on file  Stress: Not on file  Social Connections: Not on file     Family History:  The patient's family history includes Coronary artery disease in his father; Diabetes in his father; Healthy in his mother; Hypotension in his father.   Review of Systems:    Please see the history of present illness.     All other systems reviewed and are otherwise negative except as noted above.   Physical Exam:    VS:  BP (!) 152/88    Pulse 79     Ht 5\' 9"  (1.753 m)    Wt 245 lb (111.1 kg)    SpO2 96%    BMI 36.18 kg/m    General: Well developed, well nourished,male appearing in no acute distress. Head: Normocephalic, atraumatic. Neck: No carotid bruits. JVD not elevated.  Lungs: Respirations regular and unlabored, without wheezes or rales.  Heart: Regular rate and rhythm. No S3 or S4.  No murmur, no rubs, or gallops appreciated. Abdomen: Appears non-distended. No obvious abdominal masses. Msk:  Strength and tone appear normal for age. No obvious joint deformities or effusions. Extremities: No  clubbing or cyanosis. No pitting edema.  Distal pedal pulses are 2+ bilaterally. Neuro: Alert and oriented X 3. Moves all extremities spontaneously. No focal deficits noted. Psych:  Responds to questions appropriately with a normal affect. Skin: No rashes or lesions noted  Wt Readings from Last 3 Encounters:  04/12/21 245 lb (111.1 kg)  05/23/20 235 lb (106.6 kg)  05/21/20 240 lb (108.9 kg)     Studies/Labs Reviewed:   EKG:  EKG is ordered today.  The ekg ordered today demonstrates NSR, HR 79 with no acute ST changes.   Recent Labs: 05/21/2020: BUN 11; Creatinine, Ser 1.10; Hemoglobin 15.4; Platelets 325; Potassium 3.0; Sodium 138   Lipid Panel No results found for: CHOL, TRIG, HDL, CHOLHDL, VLDL, LDLCALC, LDLDIRECT  Additional studies/ records that were reviewed today include:   EKG: 08/27/2019: NSR, HR 64 with borderline LVH.   Assessment:    1. Chest pain, unspecified type   2. Bilateral lower extremity edema   3. Essential hypertension   4. Mixed hyperlipidemia   5. Need for immunization against influenza      Plan:   In order of problems listed above:  1. Chest Pain with Mixed Features - His chest pain at rest seems atypical but he does report dyspnea on exertion over the past few years. Overall, he is not very active at baseline. Given his cardiac risk factors (HTN, HLD and family history of CAD with his dad  requiring CABG), reviewed options for ischemic testing with the patient and will plan for a Coronary CT.   2. Lower Extremity Edema - He was previously consuming fast food for 2 meals a day but has reduced this over the past week and his edema has almost resolved. Suspect this was secondary to his significant sodium intake. Can consider adding Chlorthalidone for BP control as outlined below pending his BP trend but he is not volume overloaded on examination today and does not currently require diuretic therapy.   3. HTN - His SBP was in the 160's on initial check, rechecked and BP improved to 152/88. He has made significant dietary changes as outlined above. He was provided with a BP log and has been asked to report back on his readings. Currently on Losartan 100mg  daily but if BP remains elevated, would consider adding Chlorthalidone or Amlodipine.    4. HLD - Recently started on Crestor 10mg  daily his PCP. Will request a copy of most recent labs.   *The patient did NOT receive a flu shot during this encounter but it was listed and could not be removed. Clinical staff have contacted IT to have this corrected.    Medication Adjustments/Labs and Tests Ordered: Current medicines are reviewed at length with the patient today.  Concerns regarding medicines are outlined above.  Medication changes, Labs and Tests ordered today are listed in the Patient Instructions below. Patient Instructions  Medication Instructions:   Take Lopressor 100 mg 2 hours prior to CT Scan   *If you need a refill on your cardiac medications before your next appointment, please call your pharmacy*   Lab Work: Your physician recommends that you return for lab work one week prior to CT Scan.   If you have labs (blood work) drawn today and your tests are completely normal, you will receive your results only by: MyChart Message (if you have MyChart) OR A paper copy in the mail If you have any lab test that is abnormal or  we need to change your treatment,  we will call you to review the results.   Testing/Procedures: Coronary CT   Follow-Up: At [facility], you and your health needs are our priority.  As part of our continuing mission to provide you with exceptional heart care, we have created designated Provider Care Teams.  These Care Teams include your primary Cardiologist (physician) and Advanced Practice Providers (APPs -  Physician Assistants and Nurse Practitioners) who all work together to provide you with the care you need, when you need it.  We recommend signing up for the patient portal called "MyChart".  Sign up information is provided on this After Visit Summary.  MyChart is used to connect with patients for Virtual Visits (Telemedicine).  Patients are able to view lab/test results, encounter notes, upcoming appointments, etc.  Non-urgent messages can be sent to your provider as well.   To learn more about what you can do with MyChart, go to [email or web address].    Your next appointment:   4 -5 month(s)  The format for your next appointment:   In Person  Provider:   Dina Rich, MD or Randall An, PA-C    Other Instructions Your physician has requested that you regularly monitor and record your blood pressure readings at home. Please use the same machine at the same time of day to check your readings and record them to bring to your follow-up visit.     Your cardiac CT will be scheduled at one of the below locations:   [facility] [address] [place], [place] [address] [phone]  OR  [facility] [address] [place], [place] [address] [phone]  If scheduled at [facility], please arrive at the [facility] main entrance (entrance A) of [facility] 30 minutes prior to test start time. You can use the FREE valet parking offered at the main entrance (encouraged to control the heart  rate for the test) Proceed to the [facility] Radiology Department (first floor) to check-in and test prep.  If scheduled at [facility], please arrive 15 mins early for check-in and test prep.  Please follow these instructions carefully (unless otherwise directed):  Hold all erectile dysfunction medications at least 3 days (72 hrs) prior to test.  On the Night Before the Test: Be sure to Drink plenty of water. Do not consume any caffeinated/decaffeinated beverages or chocolate 12 hours prior to your test. Do not take any antihistamines 12 hours prior to your test.  On the Day of the Test: Drink plenty of water until 1 hour prior to the test. Do not eat any food 4 hours prior to the test. You may take your regular medications prior to the test.  Take metoprolol (Lopressor) two hours prior to test. HOLD Furosemide/Hydrochlorothiazide morning of the test. FEMALES- please wear underwire-free bra if available, avoid dresses & tight clothing   *For Clinical Staff only. Please instruct patient the following:* Heart Rate Medication Recommendations for Cardiac CT  Resting HR < 50 bpm  No medication  Resting HR 50-60 bpm and BP >110/50 mmHG   Consider Metoprolol tartrate 25 mg PO 90-120 min prior to scan  Resting HR 60-65 bpm and BP >110/50 mmHG  Metoprolol tartrate 50 mg PO 90-120 minutes prior to scan   Resting HR > 65 bpm and BP >110/50 mmHG  Metoprolol tartrate 100 mg PO 90-120 minutes prior to scan  Consider Ivabradine 10-15 mg PO or a calcium channel blocker for resting HR >  60 bpm and contraindication to metoprolol tartrate  Consider Ivabradine 10-15 mg PO in combination with metoprolol tartrate for HR >80 bpm         After the Test: Drink plenty of water. After receiving IV contrast, you may experience a mild flushed feeling. This is normal. On occasion, you may experience a mild rash up to 24 hours after the test. This is not dangerous. If this occurs,  you can take Benadryl 25 mg and increase your fluid intake. If you experience trouble breathing, this can be serious. If it is severe call 911 IMMEDIATELY. If it is mild, please call our office. If you take any of these medications: Glipizide/Metformin, Avandament, Glucavance, please do not take 48 hours after completing test unless otherwise instructed.  Please allow 2-4 weeks for scheduling of routine cardiac CTs. Some insurance companies require a pre-authorization which may delay scheduling of this test.   For non-scheduling related questions, please contact the cardiac imaging nurse navigator should you have any questions/concerns: Rockwell Alexandria, Cardiac Imaging Nurse Navigator Larey Brick, Cardiac Imaging Nurse Navigator [facility] Direct Office Dial: [phone]   For scheduling needs, including cancellations and rescheduling, please call [place], [phone].    Signed, Ellsworth Lennox, PA-C  04/12/2021 8:12 PM    [facility] [address] [place], [place] [address] Phone: [phone] Fax: [phone]

## 2021-04-12 NOTE — Patient Instructions (Signed)
Medication Instructions:   Take Lopressor 100 mg 2 hours prior to CT Scan   *If you need a refill on your cardiac medications before your next appointment, please call your pharmacy*   Lab Work: Your physician recommends that you return for lab work one week prior to CT Scan.   If you have labs (blood work) drawn today and your tests are completely normal, you will receive your results only by: MyChart Message (if you have MyChart) OR A paper copy in the mail If you have any lab test that is abnormal or we need to change your treatment, we will call you to review the results.   Testing/Procedures: Coronary CT   Follow-Up: At [facility], you and your health needs are our priority.  As part of our continuing mission to provide you with exceptional heart care, we have created designated Provider Care Teams.  These Care Teams include your primary Cardiologist (physician) and Advanced Practice Providers (APPs -  Physician Assistants and Nurse Practitioners) who all work together to provide you with the care you need, when you need it.  We recommend signing up for the patient portal called "MyChart".  Sign up information is provided on this After Visit Summary.  MyChart is used to connect with patients for Virtual Visits (Telemedicine).  Patients are able to view lab/test results, encounter notes, upcoming appointments, etc.  Non-urgent messages can be sent to your provider as well.   To learn more about what you can do with MyChart, go to [email or web address].    Your next appointment:   4 -5 month(s)  The format for your next appointment:   In Person  Provider:   Dina Rich, MD or Randall An, PA-C    Other Instructions Your physician has requested that you regularly monitor and record your blood pressure readings at home. Please use the same machine at the same time of day to check your readings and record them to bring to your follow-up visit.     Your cardiac  CT will be scheduled at one of the below locations:   [facility] [address] [place], [place] [address] [phone]  OR  [facility] [address] [place], [place] [address] [phone]  If scheduled at [facility], please arrive at the [facility] main entrance (entrance A) of [facility] 30 minutes prior to test start time. You can use the FREE valet parking offered at the main entrance (encouraged to control the heart rate for the test) Proceed to the [facility] Radiology Department (first floor) to check-in and test prep.  If scheduled at [facility], please arrive 15 mins early for check-in and test prep.  Please follow these instructions carefully (unless otherwise directed):  Hold all erectile dysfunction medications at least 3 days (72 hrs) prior to test.  On the Night Before the Test: Be sure to Drink plenty of water. Do not consume any caffeinated/decaffeinated beverages or chocolate 12 hours prior to your test. Do not take any antihistamines 12 hours prior to your test.  On the Day of the Test: Drink plenty of water until 1 hour prior to the test. Do not eat any food 4 hours prior to the test. You may take your regular medications prior to the test.  Take metoprolol (Lopressor) two hours prior to test. HOLD Furosemide/Hydrochlorothiazide morning of the test. FEMALES- please wear underwire-free bra if available, avoid dresses & tight clothing   *  For Clinical Staff only. Please instruct patient the following:* Heart Rate Medication Recommendations for Cardiac CT  Resting HR < 50 bpm  No medication  Resting HR 50-60 bpm and BP >110/50 mmHG   Consider Metoprolol tartrate 25 mg PO 90-120 min prior to scan  Resting HR 60-65 bpm and BP >110/50 mmHG  Metoprolol tartrate 50 mg PO 90-120 minutes prior to scan   Resting HR > 65 bpm and BP >110/50 mmHG  Metoprolol  tartrate 100 mg PO 90-120 minutes prior to scan  Consider Ivabradine 10-15 mg PO or a calcium channel blocker for resting HR >60 bpm and contraindication to metoprolol tartrate  Consider Ivabradine 10-15 mg PO in combination with metoprolol tartrate for HR >80 bpm         After the Test: Drink plenty of water. After receiving IV contrast, you may experience a mild flushed feeling. This is normal. On occasion, you may experience a mild rash up to 24 hours after the test. This is not dangerous. If this occurs, you can take Benadryl 25 mg and increase your fluid intake. If you experience trouble breathing, this can be serious. If it is severe call 911 IMMEDIATELY. If it is mild, please call our office. If you take any of these medications: Glipizide/Metformin, Avandament, Glucavance, please do not take 48 hours after completing test unless otherwise instructed.  Please allow 2-4 weeks for scheduling of routine cardiac CTs. Some insurance companies require a pre-authorization which may delay scheduling of this test.   For non-scheduling related questions, please contact the cardiac imaging nurse navigator should you have any questions/concerns: Rockwell Alexandria, Cardiac Imaging Nurse Navigator Larey Brick, Cardiac Imaging Nurse Navigator [facility] Direct Office Dial: [phone]   For scheduling needs, including cancellations and rescheduling, please call [place], [phone].

## 2021-04-25 ENCOUNTER — Other Ambulatory Visit: Payer: Self-pay | Admitting: Family Medicine

## 2021-04-25 ENCOUNTER — Ambulatory Visit
Admission: EM | Admit: 2021-04-25 | Discharge: 2021-04-25 | Disposition: A | Discharge status: Home / Self Care | Payer: Federal, State, Local not specified - PPO | Attending: Family Medicine | Admitting: Family Medicine

## 2021-04-25 ENCOUNTER — Other Ambulatory Visit: Payer: Self-pay

## 2021-04-25 DIAGNOSIS — I10 Essential (primary) hypertension: Secondary | ICD-10-CM

## 2021-04-25 DIAGNOSIS — R609 Edema, unspecified: Secondary | ICD-10-CM

## 2021-04-25 MED ORDER — CHLORTHALIDONE 25 MG PO TABS: 25.0000 mg | ORAL_TABLET | Freq: Every day | ORAL | 0 refills | Status: DC

## 2021-04-25 NOTE — ED Triage Notes (Signed)
Patient states his BP has been up for the past 2 weeks.   Patient states he only takes Losartan 100mg  and it is not working.   Patient states that both of his legs are swollen  Patient seen Cardiologist on 12/22 and she has monitoring your BP but did not make any changes to meds.

## 2021-04-25 NOTE — Discharge Instructions (Signed)
Follow-up with either your cardiologist or your primary care provider in the next 1 to 2 weeks to recheck your blood pressures and to recheck your labs to ensure tolerance of the medication.  Follow-up sooner if having any side effects or concerns

## 2021-04-25 NOTE — ED Provider Notes (Signed)
[facility]    CSN: [id number] Arrival date & time: 04/25/21  0830      History   Chief Complaint Chief Complaint  Patient presents with   Hypertension    HPI [name] is a 61 y.o. male.   Patient presenting today with several week history of elevated blood pressure readings.  He is he saw his cardiologist on 04/12/2021 regarding this issue and was told to log home readings and continue working on lifestyle modifications to improve blood pressure control.  Currently taking metoprolol, losartan and states they are not working for him.  He denies chest pain, shortness of breath, headaches, dizziness, visual changes.  He states he continues to have issues with extremity edema both upper and lower and also feels this is related to his blood pressures.  Has not yet reached out to PCP or cardiologist regarding these concerns since last appointment.  Next follow-up with cardiology is scheduled for 08/2021.   Past Medical History:  Diagnosis Date   Hypertension     There are no problems to display for this patient.   Past Surgical History:  Procedure Laterality Date   CHOLECYSTECTOMY         Home Medications    Prior to Admission medications   Medication Sig Start Date End Date Taking? Authorizing Provider  chlorthalidone (HYGROTON) 25 MG tablet Take 1 tablet (25 mg total) by mouth daily. 04/25/21  Yes Particia NearingLane, Alizabeth Antonio Elizabeth, PA-C  buPROPion (WELLBUTRIN XL) 300 MG 24 hr tablet Take 300 mg by mouth daily. 05/01/19   [provider]  COVID-19 mRNA vaccine, Moderna, (MODERNA COVID-19 VACCINE) 100 MCG/0.5ML injection Inject into the muscle. 09/07/20   Judyann MunsonSnider, Cynthia, MD  Iron-Vitamin C (VITRON-C PO) Take by mouth.    [provider]  losartan (COZAAR) 100 MG tablet Take 100 mg by mouth daily.    [provider]  Magnesium 500 MG CAPS Take by mouth.    [provider]  metoprolol tartrate (LOPRESSOR) 100 MG tablet Take 1 tablet (100  mg total) by mouth as directed. Take 2 hours prior to CT Scan 04/12/21   Iran OuchStrader, GrenadaBrittany M, PA-C  NIACIN-INOSITOL PO Take by mouth.    [provider]  Omega-3 Fatty Acids (FISH OIL) 1000 MG CAPS Take 2,000 mg by mouth.    [provider]  potassium chloride SA (KLOR-CON) 20 MEQ tablet Take 1 tablet (20 mEq total) by mouth 2 (two) times daily. 08/27/19   Triplett, Tammy, PA-C  rosuvastatin (CRESTOR) 10 MG tablet Take 1 tablet (10 mg total) by mouth daily. 04/12/21 07/11/21  Strader, Lennart PallBrittany M, PA-C  VITAMIN D, ERGOCALCIFEROL, PO Take by mouth.    [provider]    Family History Family History  Problem Relation Age of Onset   Healthy Mother    Diabetes Father    Hypotension Father    Coronary artery disease Father     Social History Social History   Tobacco Use   Smoking status: Never   Smokeless tobacco: Never  Vaping Use   Vaping Use: Never used  Substance Use Topics   Alcohol use: Never   Drug use: Never     Allergies   Patient has no known allergies.   Review of Systems Review of Systems Per HPI  Physical Exam Triage Vital Signs ED Triage Vitals  Enc Vitals Group     BP 04/25/21 1006 (!) 176/97     Pulse Rate 04/25/21 1006 75  Resp 04/25/21 1006 20     Temp 04/25/21 1006 98.1 F (36.7 C)     Temp Source 04/25/21 1006 Oral     SpO2 04/25/21 1006 94 %     Weight --      Height --      Head Circumference --      Peak Flow --      Pain Score 04/25/21 1004 0     Pain Loc --      Pain Edu? --      Excl. in GC? --    No data found.  Updated Vital Signs BP (!) 176/97 (BP Location: Right Arm)    Pulse 75    Temp 98.1 F (36.7 C) (Oral)    Resp 20    SpO2 94%   Visual Acuity Right Eye Distance:   Left Eye Distance:   Bilateral Distance:    Right Eye Near:   Left Eye Near:    Bilateral Near:     Physical Exam Vitals and nursing note reviewed.  Constitutional:      Appearance: Normal appearance.  HENT:     Head:  Atraumatic.     Right Ear: Tympanic membrane normal.     Left Ear: Tympanic membrane normal.  Eyes:     Extraocular Movements: Extraocular movements intact.     Conjunctiva/sclera: Conjunctivae normal.  Cardiovascular:     Rate and Rhythm: Normal rate and regular rhythm.     Heart sounds: Normal heart sounds.  Pulmonary:     Effort: Pulmonary effort is normal.     Breath sounds: Normal breath sounds.  Musculoskeletal:        General: No swelling. Normal range of motion.     Cervical back: Normal range of motion and neck supple.     Right lower leg: No edema.     Left lower leg: No edema.  Skin:    General: Skin is warm and dry.     Findings: No erythema.  Neurological:     General: No focal deficit present.     Mental Status: He is oriented to person, place, and time.     Comments: All 4 extremities neurovascularly intact  Psychiatric:        Mood and Affect: Mood normal.        Thought Content: Thought content normal.        Judgment: Judgment normal.     UC Treatments / Results  Labs (all labs ordered are listed, but only abnormal results are displayed) Labs Reviewed - No data to display  EKG   Radiology No results found.  Procedures Procedures (including critical care time)  Medications Ordered in UC Medications - No data to display  Initial Impression / Assessment and Plan / UC Course  I have reviewed the triage vital signs and the nursing notes.  Pertinent labs & imaging results that were available during my care of the patient were reviewed by me and considered in my medical decision making (see chart for details).     Hypertensive today in triage, otherwise vital signs benign and reassuring.  Exam completely benign without abnormal findings.  Cardiology note from 2 weeks ago reviewed, per provider at that time consider starting chlorthalidone or amlodipine.  Given his concurrent complaint of edema, feel it is reasonable to start very low-dose  chlorthalidone and have him follow-up in the next 1 to 2 weeks with either cardiology or PCP for blood pressure recheck and BMP.  It  appears in the past that his potassium has been borderline or low, he is currently on a potassium supplement and labs through [facility] about a week ago the patient shows record of showing a potassium level of 3.8 currently.  Continue to log readings, continue low-sodium diet and increasing exercise.  Follow-up sooner if having side effects or worsening issues.  Final Clinical Impressions(s) / UC Diagnoses   Final diagnoses:  Essential hypertension  Edema, unspecified type     Discharge Instructions      Follow-up with either your cardiologist or your primary care provider in the next 1 to 2 weeks to recheck your blood pressures and to recheck your labs to ensure tolerance of the medication.  Follow-up sooner if having any side effects or concerns    ED Prescriptions     Medication Sig Dispense Auth. Provider   chlorthalidone (HYGROTON) 25 MG tablet Take 1 tablet (25 mg total) by mouth daily. 30 tablet Particia Nearing, [place]      PDMP not reviewed this encounter.   Particia Nearing, [place] 04/25/21 1202

## 2021-04-26 ENCOUNTER — Telehealth (HOSPITAL_COMMUNITY): Payer: Self-pay | Admitting: *Deleted

## 2021-04-26 NOTE — Telephone Encounter (Signed)
Reaching out to patient to offer assistance regarding upcoming cardiac imaging study; pt verbalizes understanding of appt date/time, parking situation and where to check in, pre-test NPO status and medications ordered, and verified current allergies; name and call back number provided for further questions should they arise ° °Angelina Neece RN Navigator Cardiac Imaging °[facility] °[phone] office °[phone] cell ° °Patient to take 100mg metoprolol tartrate two hours prior to cardiac CT scan. He is aware to arrive at 3:30pm for his 4pm scan. °

## 2021-04-26 NOTE — Telephone Encounter (Signed)
Provider not affiliated with this office-refused

## 2021-04-30 ENCOUNTER — Ambulatory Visit (HOSPITAL_COMMUNITY)
Admission: RE | Admit: 2021-04-30 | Discharge: 2021-04-30 | Disposition: A | Discharge status: Home / Self Care | Payer: Federal, State, Local not specified - PPO | Source: Ambulatory Visit | Attending: Student | Admitting: Student

## 2021-04-30 ENCOUNTER — Other Ambulatory Visit: Payer: Self-pay

## 2021-04-30 ENCOUNTER — Encounter (HOSPITAL_COMMUNITY): Payer: Self-pay

## 2021-04-30 DIAGNOSIS — R079 Chest pain, unspecified: Secondary | ICD-10-CM | POA: Diagnosis not present

## 2021-04-30 MED ORDER — NITROGLYCERIN 0.4 MG SL SUBL
SUBLINGUAL_TABLET | SUBLINGUAL | Status: AC
  Filled 2021-04-30: qty 2

## 2021-04-30 MED ORDER — IOHEXOL 350 MG/ML SOLN
95.0000 mL | Freq: Once | INTRAVENOUS | Status: AC | PRN
  Administered 2021-04-30: 95 mL via INTRAVENOUS

## 2021-04-30 MED ORDER — NITROGLYCERIN 0.4 MG SL SUBL
0.8000 mg | SUBLINGUAL_TABLET | Freq: Once | SUBLINGUAL | Status: AC
  Administered 2021-04-30: 0.8 mg via SUBLINGUAL

## 2021-05-01 ENCOUNTER — Ambulatory Visit: Payer: Federal, State, Local not specified - PPO | Admitting: Cardiology

## 2021-05-31 ENCOUNTER — Ambulatory Visit: Payer: Federal, State, Local not specified - PPO | Admitting: Student

## 2021-06-01 ENCOUNTER — Other Ambulatory Visit: Payer: Self-pay | Admitting: Family Medicine

## 2021-08-01 DIAGNOSIS — M545 Low back pain, unspecified: Secondary | ICD-10-CM | POA: Diagnosis not present

## 2021-08-08 DIAGNOSIS — M545 Low back pain, unspecified: Secondary | ICD-10-CM | POA: Diagnosis not present

## 2021-08-08 DIAGNOSIS — M542 Cervicalgia: Secondary | ICD-10-CM | POA: Diagnosis not present

## 2021-08-08 DIAGNOSIS — E6609 Other obesity due to excess calories: Secondary | ICD-10-CM | POA: Diagnosis not present

## 2021-08-08 DIAGNOSIS — Z6836 Body mass index (BMI) 36.0-36.9, adult: Secondary | ICD-10-CM | POA: Diagnosis not present

## 2021-08-15 DIAGNOSIS — M7542 Impingement syndrome of left shoulder: Secondary | ICD-10-CM | POA: Diagnosis not present

## 2021-08-15 DIAGNOSIS — M25512 Pain in left shoulder: Secondary | ICD-10-CM | POA: Diagnosis not present

## 2021-08-28 DIAGNOSIS — S43432A Superior glenoid labrum lesion of left shoulder, initial encounter: Secondary | ICD-10-CM | POA: Diagnosis not present

## 2021-08-28 DIAGNOSIS — M19012 Primary osteoarthritis, left shoulder: Secondary | ICD-10-CM | POA: Diagnosis not present

## 2021-08-28 DIAGNOSIS — G8918 Other acute postprocedural pain: Secondary | ICD-10-CM | POA: Diagnosis not present

## 2021-08-28 DIAGNOSIS — M25512 Pain in left shoulder: Secondary | ICD-10-CM | POA: Diagnosis not present

## 2021-08-28 DIAGNOSIS — M75112 Incomplete rotator cuff tear or rupture of left shoulder, not specified as traumatic: Secondary | ICD-10-CM | POA: Diagnosis not present

## 2021-08-28 DIAGNOSIS — M7542 Impingement syndrome of left shoulder: Secondary | ICD-10-CM | POA: Diagnosis not present

## 2021-08-28 DIAGNOSIS — I890 Lymphedema, not elsewhere classified: Secondary | ICD-10-CM | POA: Diagnosis not present

## 2021-08-28 DIAGNOSIS — Z4789 Encounter for other orthopedic aftercare: Secondary | ICD-10-CM | POA: Diagnosis not present

## 2021-08-29 DIAGNOSIS — Z4789 Encounter for other orthopedic aftercare: Secondary | ICD-10-CM | POA: Diagnosis not present

## 2021-08-29 DIAGNOSIS — I890 Lymphedema, not elsewhere classified: Secondary | ICD-10-CM | POA: Diagnosis not present

## 2021-08-29 DIAGNOSIS — M7542 Impingement syndrome of left shoulder: Secondary | ICD-10-CM | POA: Diagnosis not present

## 2021-08-29 DIAGNOSIS — M25512 Pain in left shoulder: Secondary | ICD-10-CM | POA: Diagnosis not present

## 2021-08-30 DIAGNOSIS — Z4789 Encounter for other orthopedic aftercare: Secondary | ICD-10-CM | POA: Diagnosis not present

## 2021-08-30 DIAGNOSIS — I890 Lymphedema, not elsewhere classified: Secondary | ICD-10-CM | POA: Diagnosis not present

## 2021-08-30 DIAGNOSIS — M25512 Pain in left shoulder: Secondary | ICD-10-CM | POA: Diagnosis not present

## 2021-08-30 DIAGNOSIS — M7542 Impingement syndrome of left shoulder: Secondary | ICD-10-CM | POA: Diagnosis not present

## 2021-08-31 DIAGNOSIS — Z4789 Encounter for other orthopedic aftercare: Secondary | ICD-10-CM | POA: Diagnosis not present

## 2021-08-31 DIAGNOSIS — M25512 Pain in left shoulder: Secondary | ICD-10-CM | POA: Diagnosis not present

## 2021-08-31 DIAGNOSIS — M7542 Impingement syndrome of left shoulder: Secondary | ICD-10-CM | POA: Diagnosis not present

## 2021-08-31 DIAGNOSIS — I890 Lymphedema, not elsewhere classified: Secondary | ICD-10-CM | POA: Diagnosis not present

## 2021-09-01 DIAGNOSIS — M25512 Pain in left shoulder: Secondary | ICD-10-CM | POA: Diagnosis not present

## 2021-09-01 DIAGNOSIS — Z4789 Encounter for other orthopedic aftercare: Secondary | ICD-10-CM | POA: Diagnosis not present

## 2021-09-01 DIAGNOSIS — M7542 Impingement syndrome of left shoulder: Secondary | ICD-10-CM | POA: Diagnosis not present

## 2021-09-01 DIAGNOSIS — I890 Lymphedema, not elsewhere classified: Secondary | ICD-10-CM | POA: Diagnosis not present

## 2021-09-02 DIAGNOSIS — I890 Lymphedema, not elsewhere classified: Secondary | ICD-10-CM | POA: Diagnosis not present

## 2021-09-02 DIAGNOSIS — M25512 Pain in left shoulder: Secondary | ICD-10-CM | POA: Diagnosis not present

## 2021-09-02 DIAGNOSIS — M7542 Impingement syndrome of left shoulder: Secondary | ICD-10-CM | POA: Diagnosis not present

## 2021-09-02 DIAGNOSIS — Z4789 Encounter for other orthopedic aftercare: Secondary | ICD-10-CM | POA: Diagnosis not present

## 2021-09-03 DIAGNOSIS — M25512 Pain in left shoulder: Secondary | ICD-10-CM | POA: Diagnosis not present

## 2021-09-03 DIAGNOSIS — Z4789 Encounter for other orthopedic aftercare: Secondary | ICD-10-CM | POA: Diagnosis not present

## 2021-09-03 DIAGNOSIS — M7542 Impingement syndrome of left shoulder: Secondary | ICD-10-CM | POA: Diagnosis not present

## 2021-09-03 DIAGNOSIS — I890 Lymphedema, not elsewhere classified: Secondary | ICD-10-CM | POA: Diagnosis not present

## 2021-09-04 DIAGNOSIS — I890 Lymphedema, not elsewhere classified: Secondary | ICD-10-CM | POA: Diagnosis not present

## 2021-09-04 DIAGNOSIS — Z4789 Encounter for other orthopedic aftercare: Secondary | ICD-10-CM | POA: Diagnosis not present

## 2021-09-04 DIAGNOSIS — M25512 Pain in left shoulder: Secondary | ICD-10-CM | POA: Diagnosis not present

## 2021-09-04 DIAGNOSIS — M7542 Impingement syndrome of left shoulder: Secondary | ICD-10-CM | POA: Diagnosis not present

## 2021-09-05 DIAGNOSIS — M25512 Pain in left shoulder: Secondary | ICD-10-CM | POA: Diagnosis not present

## 2021-09-05 DIAGNOSIS — I890 Lymphedema, not elsewhere classified: Secondary | ICD-10-CM | POA: Diagnosis not present

## 2021-09-05 DIAGNOSIS — M7542 Impingement syndrome of left shoulder: Secondary | ICD-10-CM | POA: Diagnosis not present

## 2021-09-05 DIAGNOSIS — Z4789 Encounter for other orthopedic aftercare: Secondary | ICD-10-CM | POA: Diagnosis not present

## 2021-09-06 DIAGNOSIS — I890 Lymphedema, not elsewhere classified: Secondary | ICD-10-CM | POA: Diagnosis not present

## 2021-09-06 DIAGNOSIS — M25312 Other instability, left shoulder: Secondary | ICD-10-CM | POA: Diagnosis not present

## 2021-09-06 DIAGNOSIS — M25612 Stiffness of left shoulder, not elsewhere classified: Secondary | ICD-10-CM | POA: Diagnosis not present

## 2021-09-06 DIAGNOSIS — R531 Weakness: Secondary | ICD-10-CM | POA: Diagnosis not present

## 2021-09-06 DIAGNOSIS — M7542 Impingement syndrome of left shoulder: Secondary | ICD-10-CM | POA: Diagnosis not present

## 2021-09-06 DIAGNOSIS — M25512 Pain in left shoulder: Secondary | ICD-10-CM | POA: Diagnosis not present

## 2021-09-06 DIAGNOSIS — Z4789 Encounter for other orthopedic aftercare: Secondary | ICD-10-CM | POA: Diagnosis not present

## 2021-09-07 DIAGNOSIS — I890 Lymphedema, not elsewhere classified: Secondary | ICD-10-CM | POA: Diagnosis not present

## 2021-09-07 DIAGNOSIS — M25512 Pain in left shoulder: Secondary | ICD-10-CM | POA: Diagnosis not present

## 2021-09-07 DIAGNOSIS — Z4789 Encounter for other orthopedic aftercare: Secondary | ICD-10-CM | POA: Diagnosis not present

## 2021-09-07 DIAGNOSIS — M7542 Impingement syndrome of left shoulder: Secondary | ICD-10-CM | POA: Diagnosis not present

## 2021-09-08 DIAGNOSIS — M25512 Pain in left shoulder: Secondary | ICD-10-CM | POA: Diagnosis not present

## 2021-09-08 DIAGNOSIS — Z4789 Encounter for other orthopedic aftercare: Secondary | ICD-10-CM | POA: Diagnosis not present

## 2021-09-08 DIAGNOSIS — I890 Lymphedema, not elsewhere classified: Secondary | ICD-10-CM | POA: Diagnosis not present

## 2021-09-08 DIAGNOSIS — M7542 Impingement syndrome of left shoulder: Secondary | ICD-10-CM | POA: Diagnosis not present

## 2021-09-09 DIAGNOSIS — Z4789 Encounter for other orthopedic aftercare: Secondary | ICD-10-CM | POA: Diagnosis not present

## 2021-09-09 DIAGNOSIS — I890 Lymphedema, not elsewhere classified: Secondary | ICD-10-CM | POA: Diagnosis not present

## 2021-09-09 DIAGNOSIS — M7542 Impingement syndrome of left shoulder: Secondary | ICD-10-CM | POA: Diagnosis not present

## 2021-09-09 DIAGNOSIS — M25512 Pain in left shoulder: Secondary | ICD-10-CM | POA: Diagnosis not present

## 2021-09-10 DIAGNOSIS — I890 Lymphedema, not elsewhere classified: Secondary | ICD-10-CM | POA: Diagnosis not present

## 2021-09-10 DIAGNOSIS — M25512 Pain in left shoulder: Secondary | ICD-10-CM | POA: Diagnosis not present

## 2021-09-10 DIAGNOSIS — M7542 Impingement syndrome of left shoulder: Secondary | ICD-10-CM | POA: Diagnosis not present

## 2021-09-10 DIAGNOSIS — Z4789 Encounter for other orthopedic aftercare: Secondary | ICD-10-CM | POA: Diagnosis not present

## 2021-09-11 DIAGNOSIS — M25512 Pain in left shoulder: Secondary | ICD-10-CM | POA: Diagnosis not present

## 2021-09-11 DIAGNOSIS — M25312 Other instability, left shoulder: Secondary | ICD-10-CM | POA: Diagnosis not present

## 2021-09-11 DIAGNOSIS — M7542 Impingement syndrome of left shoulder: Secondary | ICD-10-CM | POA: Diagnosis not present

## 2021-09-11 DIAGNOSIS — M25612 Stiffness of left shoulder, not elsewhere classified: Secondary | ICD-10-CM | POA: Diagnosis not present

## 2021-09-11 DIAGNOSIS — Z4789 Encounter for other orthopedic aftercare: Secondary | ICD-10-CM | POA: Diagnosis not present

## 2021-09-11 DIAGNOSIS — R531 Weakness: Secondary | ICD-10-CM | POA: Diagnosis not present

## 2021-09-11 DIAGNOSIS — I890 Lymphedema, not elsewhere classified: Secondary | ICD-10-CM | POA: Diagnosis not present

## 2021-09-12 DIAGNOSIS — Z4789 Encounter for other orthopedic aftercare: Secondary | ICD-10-CM | POA: Diagnosis not present

## 2021-09-12 DIAGNOSIS — M25512 Pain in left shoulder: Secondary | ICD-10-CM | POA: Diagnosis not present

## 2021-09-12 DIAGNOSIS — I890 Lymphedema, not elsewhere classified: Secondary | ICD-10-CM | POA: Diagnosis not present

## 2021-09-12 DIAGNOSIS — M7542 Impingement syndrome of left shoulder: Secondary | ICD-10-CM | POA: Diagnosis not present

## 2021-09-13 DIAGNOSIS — M25512 Pain in left shoulder: Secondary | ICD-10-CM | POA: Diagnosis not present

## 2021-09-13 DIAGNOSIS — M7542 Impingement syndrome of left shoulder: Secondary | ICD-10-CM | POA: Diagnosis not present

## 2021-09-13 DIAGNOSIS — I890 Lymphedema, not elsewhere classified: Secondary | ICD-10-CM | POA: Diagnosis not present

## 2021-09-13 DIAGNOSIS — Z4789 Encounter for other orthopedic aftercare: Secondary | ICD-10-CM | POA: Diagnosis not present

## 2021-09-14 DIAGNOSIS — M25512 Pain in left shoulder: Secondary | ICD-10-CM | POA: Diagnosis not present

## 2021-09-14 DIAGNOSIS — M7542 Impingement syndrome of left shoulder: Secondary | ICD-10-CM | POA: Diagnosis not present

## 2021-09-14 DIAGNOSIS — I890 Lymphedema, not elsewhere classified: Secondary | ICD-10-CM | POA: Diagnosis not present

## 2021-09-14 DIAGNOSIS — Z4789 Encounter for other orthopedic aftercare: Secondary | ICD-10-CM | POA: Diagnosis not present

## 2021-09-15 DIAGNOSIS — M25512 Pain in left shoulder: Secondary | ICD-10-CM | POA: Diagnosis not present

## 2021-09-15 DIAGNOSIS — I890 Lymphedema, not elsewhere classified: Secondary | ICD-10-CM | POA: Diagnosis not present

## 2021-09-15 DIAGNOSIS — Z4789 Encounter for other orthopedic aftercare: Secondary | ICD-10-CM | POA: Diagnosis not present

## 2021-09-15 DIAGNOSIS — M7542 Impingement syndrome of left shoulder: Secondary | ICD-10-CM | POA: Diagnosis not present

## 2021-09-16 DIAGNOSIS — M7542 Impingement syndrome of left shoulder: Secondary | ICD-10-CM | POA: Diagnosis not present

## 2021-09-16 DIAGNOSIS — M25512 Pain in left shoulder: Secondary | ICD-10-CM | POA: Diagnosis not present

## 2021-09-16 DIAGNOSIS — I890 Lymphedema, not elsewhere classified: Secondary | ICD-10-CM | POA: Diagnosis not present

## 2021-09-16 DIAGNOSIS — Z4789 Encounter for other orthopedic aftercare: Secondary | ICD-10-CM | POA: Diagnosis not present

## 2021-09-17 DIAGNOSIS — I890 Lymphedema, not elsewhere classified: Secondary | ICD-10-CM | POA: Diagnosis not present

## 2021-09-17 DIAGNOSIS — M7542 Impingement syndrome of left shoulder: Secondary | ICD-10-CM | POA: Diagnosis not present

## 2021-09-17 DIAGNOSIS — M25512 Pain in left shoulder: Secondary | ICD-10-CM | POA: Diagnosis not present

## 2021-09-17 DIAGNOSIS — Z4789 Encounter for other orthopedic aftercare: Secondary | ICD-10-CM | POA: Diagnosis not present

## 2021-09-18 DIAGNOSIS — I890 Lymphedema, not elsewhere classified: Secondary | ICD-10-CM | POA: Diagnosis not present

## 2021-09-18 DIAGNOSIS — M25512 Pain in left shoulder: Secondary | ICD-10-CM | POA: Diagnosis not present

## 2021-09-18 DIAGNOSIS — Z4789 Encounter for other orthopedic aftercare: Secondary | ICD-10-CM | POA: Diagnosis not present

## 2021-09-18 DIAGNOSIS — M7542 Impingement syndrome of left shoulder: Secondary | ICD-10-CM | POA: Diagnosis not present

## 2021-09-19 ENCOUNTER — Ambulatory Visit: Payer: Federal, State, Local not specified - PPO | Admitting: Student

## 2021-09-19 ENCOUNTER — Encounter: Payer: Self-pay | Admitting: Student

## 2021-09-19 VITALS — BP 174/96 | HR 86 | Ht 69.0 in | Wt 251.0 lb

## 2021-09-19 DIAGNOSIS — I10 Essential (primary) hypertension: Secondary | ICD-10-CM | POA: Diagnosis not present

## 2021-09-19 DIAGNOSIS — M25312 Other instability, left shoulder: Secondary | ICD-10-CM | POA: Diagnosis not present

## 2021-09-19 DIAGNOSIS — I77810 Thoracic aortic ectasia: Secondary | ICD-10-CM

## 2021-09-19 DIAGNOSIS — M25612 Stiffness of left shoulder, not elsewhere classified: Secondary | ICD-10-CM | POA: Diagnosis not present

## 2021-09-19 DIAGNOSIS — M25512 Pain in left shoulder: Secondary | ICD-10-CM | POA: Diagnosis not present

## 2021-09-19 DIAGNOSIS — I2510 Atherosclerotic heart disease of native coronary artery without angina pectoris: Secondary | ICD-10-CM | POA: Diagnosis not present

## 2021-09-19 DIAGNOSIS — I890 Lymphedema, not elsewhere classified: Secondary | ICD-10-CM | POA: Diagnosis not present

## 2021-09-19 DIAGNOSIS — E785 Hyperlipidemia, unspecified: Secondary | ICD-10-CM

## 2021-09-19 DIAGNOSIS — M7542 Impingement syndrome of left shoulder: Secondary | ICD-10-CM | POA: Diagnosis not present

## 2021-09-19 DIAGNOSIS — Z131 Encounter for screening for diabetes mellitus: Secondary | ICD-10-CM

## 2021-09-19 DIAGNOSIS — R531 Weakness: Secondary | ICD-10-CM | POA: Diagnosis not present

## 2021-09-19 DIAGNOSIS — Z4789 Encounter for other orthopedic aftercare: Secondary | ICD-10-CM | POA: Diagnosis not present

## 2021-09-19 MED ORDER — AMLODIPINE BESYLATE 5 MG PO TABS
5.0000 mg | ORAL_TABLET | Freq: Every day | ORAL | 3 refills | Status: DC
Start: 2021-09-19 — End: 2022-09-06

## 2021-09-19 NOTE — Patient Instructions (Addendum)
Medication Instructions:   START Amlodipine 5 mg daily  Send [place] a Pharmacist, community message with BP readings in 2-3 weeks  Labwork: A1c,lipid,CMET, CBC at [facility]  Testing/Procedures: None today  Follow-Up: 1 year  Any Other Special Instructions Will Be Listed Below (If Applicable).  If you need a refill on your cardiac medications before your next appointment, please call your pharmacy.

## 2021-09-19 NOTE — Progress Notes (Signed)
Cardiology Office Note    Date:  09/19/2021   ID:  [name], DOB [birth year], MRN [id number]  PCP:  [facility]  Cardiologist: Dina Rich, MD    Chief Complaint  Patient presents with   Follow-up    6 month visit    History of Present Illness:    [name] is a 61 y.o. male with past medical history of HTN, OSA (intolerant to CPAP) and family history of CAD who presents to the office today for 79-month follow-up.  He was last examined by myself in 03/2021 and reported occasional episodes of shooting chest pain at rest but denied any specific exertional chest pain. Given his risk factors and symptoms, a Coronary CT was recommended for further evaluation. This showed only minimal CAD with less than 25% plaque along the LAD and RCA with risk factor modification recommended. His ascending aorta was mildly dilated at 4.1 cm and the radiology overread showed no acute extracardiac findings.  In talking with the patient today, he denies any recent anginal symptoms. No reported chest pain, dyspnea on exertion, orthopnea, PND or pitting edema. Says that he did quit taking Chlorthalidone as this was started after an Urgent Care visit in 04/2021 but he did not notice any changes in his blood pressure readings while on this and discontinued after 1 month. His BP was elevated at 174/96 today but he did come directly from PT as he recently underwent shoulder surgery. Says blood pressure has been elevated at home though with SBP typically in the 150's.  He does eat fast food routinely and we discussed the the effect of sodium on blood pressure.   Past Medical History:  Diagnosis Date   CAD (coronary artery disease)    a. Coronary CT in 04/2021 showed < 25% plaque along the LAD and RCA with risk factor modification recommended.   Hypertension     Past Surgical History:  Procedure Laterality Date   CHOLECYSTECTOMY      Current Medications: Outpatient  Medications Prior to Visit  Medication Sig Dispense Refill   buPROPion (WELLBUTRIN XL) 300 MG 24 hr tablet Take 300 mg by mouth daily.     COVID-19 mRNA vaccine, Moderna, (MODERNA COVID-19 VACCINE) 100 MCG/0.5ML injection Inject into the muscle. 0.25 mL 0   Iron-Vitamin C (VITRON-C PO) Take by mouth.     losartan (COZAAR) 100 MG tablet Take 100 mg by mouth daily.     Magnesium 500 MG CAPS Take by mouth.     NIACIN-INOSITOL PO Take by mouth.     Omega-3 Fatty Acids (FISH OIL) 1000 MG CAPS Take 2,000 mg by mouth.     potassium chloride SA (KLOR-CON) 20 MEQ tablet Take 1 tablet (20 mEq total) by mouth 2 (two) times daily. 14 tablet 0   rosuvastatin (CRESTOR) 10 MG tablet Take 1 tablet (10 mg total) by mouth daily. 90 tablet 3   VITAMIN D, ERGOCALCIFEROL, PO Take by mouth.     chlorthalidone (HYGROTON) 25 MG tablet Take 1 tablet (25 mg total) by mouth daily. 30 tablet 0   metoprolol tartrate (LOPRESSOR) 100 MG tablet Take 1 tablet (100 mg total) by mouth as directed. Take 2 hours prior to CT Scan 1 tablet 0   No facility-administered medications prior to visit.     Allergies:   Naprosyn [naproxen]   Social History   Socioeconomic History   Marital status: Single    Spouse name: Not on file   Number  of children: Not on file   Years of education: Not on file   Highest education level: Not on file  Occupational History   Occupation: Full Time  Tobacco Use   Smoking status: Never   Smokeless tobacco: Never  Vaping Use   Vaping Use: Never used  Substance and Sexual Activity   Alcohol use: Never   Drug use: Never   Sexual activity: Yes  Other Topics Concern   Not on file  Social History Narrative   Lives with son   Right handed   Drinks 1-2 cups caffeine daily   Social Determinants of Health   Financial Resource Strain: Not on file  Food Insecurity: Not on file  Transportation Needs: Not on file  Physical Activity: Not on file  Stress: Not on file  Social Connections: Not  on file     Family History:  The patient's family history includes Coronary artery disease in his father; Diabetes in his father; Healthy in his mother; Hypotension in his father.   Review of Systems:    Please see the history of present illness.     All other systems reviewed and are otherwise negative except as noted above.   Physical Exam:    VS:  BP (!) 174/96   Pulse 86   Ht 5\' 9"  (1.753 m)   Wt 251 lb (113.9 kg)   SpO2 95%   BMI 37.07 kg/m    General: Pleasant male appearing in no acute distress. Head: Normocephalic, atraumatic. Neck: No carotid bruits. JVD not elevated.  Lungs: Respirations regular and unlabored, without wheezes or rales.  Heart: Regular rate and rhythm. No S3 or S4.  No murmur, no rubs, or gallops appreciated. Abdomen: Appears non-distended. No obvious abdominal masses. Msk:  Strength and tone appear normal for age. No obvious joint deformities or effusions. Extremities: No clubbing or cyanosis. No pitting edema.  Distal pedal pulses are 2+ bilaterally. Neuro: Alert and oriented X 3. Moves all extremities spontaneously. No focal deficits noted. Psych:  Responds to questions appropriately with a normal affect. Skin: No rashes or lesions noted  Wt Readings from Last 3 Encounters:  09/19/21 251 lb (113.9 kg)  04/12/21 245 lb (111.1 kg)  05/23/20 235 lb (106.6 kg)     Studies/Labs Reviewed:   EKG:  EKG is not ordered today.    Recent Labs: No results found for requested labs within last 8760 hours.   Lipid Panel No results found for: CHOL, TRIG, HDL, CHOLHDL, VLDL, LDLCALC, LDLDIRECT  Additional studies/ records that were reviewed today include:   Coronary CT: 04/2021 Coronary Arteries:  Normal coronary origin.  Right dominance.   RCA is a large dominant artery that gives rise to PDA and PLVB. There is minimal (<25%) soft plaque in the mid RCA.   Left main is a large artery that gives rise to LAD, RI, and LCX arteries.   LAD is a large  vessel that has minimal (<25%) calcified plaque at the ostium and in the mid LAD.   RI has no plaque.   LCX is a non-dominant artery that gives rise to three OM   Branches.   There is no plaque.   Coronary Calcium Score:   Left main: 0   Left anterior descending artery: 42.2   Left circumflex artery: 0   Right coronary artery: 0.458   Total: 42.6   Percentile: 52nd   Other findings:   Normal pulmonary vein drainage into the left atrium.   Normal let  atrial appendage without a thrombus.   Normal size of the pulmonary artery.   Misregistration artifact.   Small PFO with left-to-right flow.   IMPRESSION: 1. Coronary calcium score of 42.6. This was 52nd percentile for age-, race-, and sex-matched controls.   2. Normal coronary origin with right dominance.   3. There is minimal (<25%) plaque in the LAD and RCA.  CAD-RADS 1.   4.  Ascending aorta mildly dilated.  4.1 cm.    Assessment:    1. Coronary artery disease involving native coronary artery of native heart without angina pectoris   2. Essential hypertension   3. Hyperlipidemia LDL goal <70   4. Screening for diabetes mellitus   5. Mild dilation of ascending aorta (HCC)      Plan:   In order of problems listed above:  1. CAD - Coronary CT in 04/2021 showed only minimal CAD with less than 25% plaque along the LAD and RCA with risk factor modification recommended. He denies any recent anginal symptoms.  - Continue Crestor 10mg  daily and Fish Oil. Will recheck an FLP with upcoming labs.   2. HTN - His BP is significantly elevated at 174/96 during today's visit but he is in pain as he recently worked with PT earlier today. However, readings have been elevated at home as well. He has remained on Losartan 100mg  daily but did stop Chlorthalidone after 1 month as he did not notice any change in his readings. Will plan to start Amlodipine 5mg  daily. He will reach out with his readings in 2-3 weeks and if BP  remains above goal, this may need to be further titrated. Also discussed the importance of limiting his sodium intake. Will check a CBC and BMET for medication management.   3. HLD - Will recheck an FLP with upcoming labs. He remains on Crestor 10mg  daily and Fish Oil.   4. Screening for Diabetes - Will check Hgb A1c with upcoming labs.   5. Dilation of Ascending Aorta - This measured at 4.1 cm by imaging in 04/2021. Will plan for repeat imaging next year unless obtained by his PCP in the interim.    Medication Adjustments/Labs and Tests Ordered: Current medicines are reviewed at length with the patient today.  Concerns regarding medicines are outlined above.  Medication changes, Labs and Tests ordered today are listed in the Patient Instructions below. Patient Instructions  Medication Instructions:   START Amlodipine 5 mg daily  Send a MYChart message with BP readings in 2-3 weeks  Labwork: A1c,lipid,CMET, CBC at  Testing/Procedures: None today  Follow-Up: 1 year  Any Other Special Instructions Will Be Listed Below (If Applicable).  If you need a refill on your cardiac medications before your next appointment, please call your pharmacy.    Signed, , PA-C  09/19/2021 7:36 PM    [facility] [address] [place], [place] 09/21/2021 Phone: [phone] Fax: [phone]

## 2021-09-20 DIAGNOSIS — Z4789 Encounter for other orthopedic aftercare: Secondary | ICD-10-CM | POA: Diagnosis not present

## 2021-09-20 DIAGNOSIS — M25312 Other instability, left shoulder: Secondary | ICD-10-CM | POA: Diagnosis not present

## 2021-09-20 DIAGNOSIS — M7542 Impingement syndrome of left shoulder: Secondary | ICD-10-CM | POA: Diagnosis not present

## 2021-09-20 DIAGNOSIS — R531 Weakness: Secondary | ICD-10-CM | POA: Diagnosis not present

## 2021-09-20 DIAGNOSIS — M25512 Pain in left shoulder: Secondary | ICD-10-CM | POA: Diagnosis not present

## 2021-09-20 DIAGNOSIS — M25612 Stiffness of left shoulder, not elsewhere classified: Secondary | ICD-10-CM | POA: Diagnosis not present

## 2021-09-20 DIAGNOSIS — I890 Lymphedema, not elsewhere classified: Secondary | ICD-10-CM | POA: Diagnosis not present

## 2021-09-21 DIAGNOSIS — I890 Lymphedema, not elsewhere classified: Secondary | ICD-10-CM | POA: Diagnosis not present

## 2021-09-21 DIAGNOSIS — Z4789 Encounter for other orthopedic aftercare: Secondary | ICD-10-CM | POA: Diagnosis not present

## 2021-09-21 DIAGNOSIS — M7542 Impingement syndrome of left shoulder: Secondary | ICD-10-CM | POA: Diagnosis not present

## 2021-09-21 DIAGNOSIS — M25512 Pain in left shoulder: Secondary | ICD-10-CM | POA: Diagnosis not present

## 2021-09-22 DIAGNOSIS — M25512 Pain in left shoulder: Secondary | ICD-10-CM | POA: Diagnosis not present

## 2021-09-22 DIAGNOSIS — I890 Lymphedema, not elsewhere classified: Secondary | ICD-10-CM | POA: Diagnosis not present

## 2021-09-22 DIAGNOSIS — Z4789 Encounter for other orthopedic aftercare: Secondary | ICD-10-CM | POA: Diagnosis not present

## 2021-09-22 DIAGNOSIS — M7542 Impingement syndrome of left shoulder: Secondary | ICD-10-CM | POA: Diagnosis not present

## 2021-09-23 DIAGNOSIS — M7542 Impingement syndrome of left shoulder: Secondary | ICD-10-CM | POA: Diagnosis not present

## 2021-09-23 DIAGNOSIS — M25512 Pain in left shoulder: Secondary | ICD-10-CM | POA: Diagnosis not present

## 2021-09-23 DIAGNOSIS — I890 Lymphedema, not elsewhere classified: Secondary | ICD-10-CM | POA: Diagnosis not present

## 2021-09-23 DIAGNOSIS — Z4789 Encounter for other orthopedic aftercare: Secondary | ICD-10-CM | POA: Diagnosis not present

## 2021-09-24 DIAGNOSIS — R531 Weakness: Secondary | ICD-10-CM | POA: Diagnosis not present

## 2021-09-24 DIAGNOSIS — M7542 Impingement syndrome of left shoulder: Secondary | ICD-10-CM | POA: Diagnosis not present

## 2021-09-24 DIAGNOSIS — I890 Lymphedema, not elsewhere classified: Secondary | ICD-10-CM | POA: Diagnosis not present

## 2021-09-24 DIAGNOSIS — M25512 Pain in left shoulder: Secondary | ICD-10-CM | POA: Diagnosis not present

## 2021-09-24 DIAGNOSIS — M25312 Other instability, left shoulder: Secondary | ICD-10-CM | POA: Diagnosis not present

## 2021-09-24 DIAGNOSIS — M25612 Stiffness of left shoulder, not elsewhere classified: Secondary | ICD-10-CM | POA: Diagnosis not present

## 2021-09-24 DIAGNOSIS — Z4789 Encounter for other orthopedic aftercare: Secondary | ICD-10-CM | POA: Diagnosis not present

## 2021-09-25 DIAGNOSIS — M7542 Impingement syndrome of left shoulder: Secondary | ICD-10-CM | POA: Diagnosis not present

## 2021-09-25 DIAGNOSIS — I890 Lymphedema, not elsewhere classified: Secondary | ICD-10-CM | POA: Diagnosis not present

## 2021-09-25 DIAGNOSIS — Z4789 Encounter for other orthopedic aftercare: Secondary | ICD-10-CM | POA: Diagnosis not present

## 2021-09-25 DIAGNOSIS — M25512 Pain in left shoulder: Secondary | ICD-10-CM | POA: Diagnosis not present

## 2021-09-26 DIAGNOSIS — M25612 Stiffness of left shoulder, not elsewhere classified: Secondary | ICD-10-CM | POA: Diagnosis not present

## 2021-09-26 DIAGNOSIS — Z4789 Encounter for other orthopedic aftercare: Secondary | ICD-10-CM | POA: Diagnosis not present

## 2021-09-26 DIAGNOSIS — I890 Lymphedema, not elsewhere classified: Secondary | ICD-10-CM | POA: Diagnosis not present

## 2021-09-26 DIAGNOSIS — M25512 Pain in left shoulder: Secondary | ICD-10-CM | POA: Diagnosis not present

## 2021-09-26 DIAGNOSIS — R531 Weakness: Secondary | ICD-10-CM | POA: Diagnosis not present

## 2021-09-26 DIAGNOSIS — M7542 Impingement syndrome of left shoulder: Secondary | ICD-10-CM | POA: Diagnosis not present

## 2021-09-26 DIAGNOSIS — M25312 Other instability, left shoulder: Secondary | ICD-10-CM | POA: Diagnosis not present

## 2021-10-01 DIAGNOSIS — M25512 Pain in left shoulder: Secondary | ICD-10-CM | POA: Diagnosis not present

## 2021-10-01 DIAGNOSIS — M25612 Stiffness of left shoulder, not elsewhere classified: Secondary | ICD-10-CM | POA: Diagnosis not present

## 2021-10-01 DIAGNOSIS — M25312 Other instability, left shoulder: Secondary | ICD-10-CM | POA: Diagnosis not present

## 2021-10-01 DIAGNOSIS — R531 Weakness: Secondary | ICD-10-CM | POA: Diagnosis not present

## 2021-10-03 DIAGNOSIS — M25512 Pain in left shoulder: Secondary | ICD-10-CM | POA: Diagnosis not present

## 2021-10-03 DIAGNOSIS — M25612 Stiffness of left shoulder, not elsewhere classified: Secondary | ICD-10-CM | POA: Diagnosis not present

## 2021-10-03 DIAGNOSIS — M25312 Other instability, left shoulder: Secondary | ICD-10-CM | POA: Diagnosis not present

## 2021-10-03 DIAGNOSIS — R531 Weakness: Secondary | ICD-10-CM | POA: Diagnosis not present

## 2021-10-08 DIAGNOSIS — M25512 Pain in left shoulder: Secondary | ICD-10-CM | POA: Diagnosis not present

## 2021-10-08 DIAGNOSIS — M25312 Other instability, left shoulder: Secondary | ICD-10-CM | POA: Diagnosis not present

## 2021-10-08 DIAGNOSIS — M25612 Stiffness of left shoulder, not elsewhere classified: Secondary | ICD-10-CM | POA: Diagnosis not present

## 2021-10-08 DIAGNOSIS — R531 Weakness: Secondary | ICD-10-CM | POA: Diagnosis not present

## 2021-10-10 DIAGNOSIS — M25512 Pain in left shoulder: Secondary | ICD-10-CM | POA: Diagnosis not present

## 2021-10-10 DIAGNOSIS — M25612 Stiffness of left shoulder, not elsewhere classified: Secondary | ICD-10-CM | POA: Diagnosis not present

## 2021-10-10 DIAGNOSIS — R531 Weakness: Secondary | ICD-10-CM | POA: Diagnosis not present

## 2021-10-10 DIAGNOSIS — M25312 Other instability, left shoulder: Secondary | ICD-10-CM | POA: Diagnosis not present

## 2021-10-15 DIAGNOSIS — M25312 Other instability, left shoulder: Secondary | ICD-10-CM | POA: Diagnosis not present

## 2021-10-15 DIAGNOSIS — R531 Weakness: Secondary | ICD-10-CM | POA: Diagnosis not present

## 2021-10-15 DIAGNOSIS — M25512 Pain in left shoulder: Secondary | ICD-10-CM | POA: Diagnosis not present

## 2021-10-15 DIAGNOSIS — M25612 Stiffness of left shoulder, not elsewhere classified: Secondary | ICD-10-CM | POA: Diagnosis not present

## 2021-10-17 DIAGNOSIS — M25612 Stiffness of left shoulder, not elsewhere classified: Secondary | ICD-10-CM | POA: Diagnosis not present

## 2021-10-17 DIAGNOSIS — R531 Weakness: Secondary | ICD-10-CM | POA: Diagnosis not present

## 2021-10-17 DIAGNOSIS — M25512 Pain in left shoulder: Secondary | ICD-10-CM | POA: Diagnosis not present

## 2021-10-17 DIAGNOSIS — M25312 Other instability, left shoulder: Secondary | ICD-10-CM | POA: Diagnosis not present

## 2021-10-22 DIAGNOSIS — R531 Weakness: Secondary | ICD-10-CM | POA: Diagnosis not present

## 2021-10-22 DIAGNOSIS — M25612 Stiffness of left shoulder, not elsewhere classified: Secondary | ICD-10-CM | POA: Diagnosis not present

## 2021-10-22 DIAGNOSIS — M25312 Other instability, left shoulder: Secondary | ICD-10-CM | POA: Diagnosis not present

## 2021-10-22 DIAGNOSIS — M25512 Pain in left shoulder: Secondary | ICD-10-CM | POA: Diagnosis not present

## 2021-10-24 DIAGNOSIS — R531 Weakness: Secondary | ICD-10-CM | POA: Diagnosis not present

## 2021-10-24 DIAGNOSIS — M25312 Other instability, left shoulder: Secondary | ICD-10-CM | POA: Diagnosis not present

## 2021-10-24 DIAGNOSIS — M25512 Pain in left shoulder: Secondary | ICD-10-CM | POA: Diagnosis not present

## 2021-10-24 DIAGNOSIS — M25612 Stiffness of left shoulder, not elsewhere classified: Secondary | ICD-10-CM | POA: Diagnosis not present

## 2021-10-29 DIAGNOSIS — M25612 Stiffness of left shoulder, not elsewhere classified: Secondary | ICD-10-CM | POA: Diagnosis not present

## 2021-10-29 DIAGNOSIS — R531 Weakness: Secondary | ICD-10-CM | POA: Diagnosis not present

## 2021-10-29 DIAGNOSIS — M25312 Other instability, left shoulder: Secondary | ICD-10-CM | POA: Diagnosis not present

## 2021-10-29 DIAGNOSIS — M25512 Pain in left shoulder: Secondary | ICD-10-CM | POA: Diagnosis not present

## 2021-10-31 DIAGNOSIS — R531 Weakness: Secondary | ICD-10-CM | POA: Diagnosis not present

## 2021-10-31 DIAGNOSIS — M25612 Stiffness of left shoulder, not elsewhere classified: Secondary | ICD-10-CM | POA: Diagnosis not present

## 2021-10-31 DIAGNOSIS — M25312 Other instability, left shoulder: Secondary | ICD-10-CM | POA: Diagnosis not present

## 2021-10-31 DIAGNOSIS — M25512 Pain in left shoulder: Secondary | ICD-10-CM | POA: Diagnosis not present

## 2021-11-05 DIAGNOSIS — M25312 Other instability, left shoulder: Secondary | ICD-10-CM | POA: Diagnosis not present

## 2021-11-05 DIAGNOSIS — M25612 Stiffness of left shoulder, not elsewhere classified: Secondary | ICD-10-CM | POA: Diagnosis not present

## 2021-11-05 DIAGNOSIS — M25512 Pain in left shoulder: Secondary | ICD-10-CM | POA: Diagnosis not present

## 2021-11-05 DIAGNOSIS — R531 Weakness: Secondary | ICD-10-CM | POA: Diagnosis not present

## 2021-11-07 DIAGNOSIS — R531 Weakness: Secondary | ICD-10-CM | POA: Diagnosis not present

## 2021-11-07 DIAGNOSIS — M25612 Stiffness of left shoulder, not elsewhere classified: Secondary | ICD-10-CM | POA: Diagnosis not present

## 2021-11-07 DIAGNOSIS — M25312 Other instability, left shoulder: Secondary | ICD-10-CM | POA: Diagnosis not present

## 2021-11-07 DIAGNOSIS — M25512 Pain in left shoulder: Secondary | ICD-10-CM | POA: Diagnosis not present

## 2021-11-23 DIAGNOSIS — R030 Elevated blood-pressure reading, without diagnosis of hypertension: Secondary | ICD-10-CM | POA: Diagnosis not present

## 2021-11-23 DIAGNOSIS — S61213A Laceration without foreign body of left middle finger without damage to nail, initial encounter: Secondary | ICD-10-CM | POA: Diagnosis not present

## 2021-11-25 DIAGNOSIS — R030 Elevated blood-pressure reading, without diagnosis of hypertension: Secondary | ICD-10-CM | POA: Diagnosis not present

## 2021-11-25 DIAGNOSIS — S61213D Laceration without foreign body of left middle finger without damage to nail, subsequent encounter: Secondary | ICD-10-CM | POA: Diagnosis not present

## 2021-11-28 DIAGNOSIS — M79642 Pain in left hand: Secondary | ICD-10-CM | POA: Diagnosis not present

## 2021-12-04 DIAGNOSIS — M25312 Other instability, left shoulder: Secondary | ICD-10-CM | POA: Diagnosis not present

## 2021-12-04 DIAGNOSIS — R531 Weakness: Secondary | ICD-10-CM | POA: Diagnosis not present

## 2021-12-04 DIAGNOSIS — M25612 Stiffness of left shoulder, not elsewhere classified: Secondary | ICD-10-CM | POA: Diagnosis not present

## 2021-12-04 DIAGNOSIS — M25512 Pain in left shoulder: Secondary | ICD-10-CM | POA: Diagnosis not present

## 2021-12-07 DIAGNOSIS — M25312 Other instability, left shoulder: Secondary | ICD-10-CM | POA: Diagnosis not present

## 2021-12-07 DIAGNOSIS — M25612 Stiffness of left shoulder, not elsewhere classified: Secondary | ICD-10-CM | POA: Diagnosis not present

## 2021-12-07 DIAGNOSIS — M25512 Pain in left shoulder: Secondary | ICD-10-CM | POA: Diagnosis not present

## 2021-12-07 DIAGNOSIS — R531 Weakness: Secondary | ICD-10-CM | POA: Diagnosis not present

## 2021-12-10 DIAGNOSIS — S61213A Laceration without foreign body of left middle finger without damage to nail, initial encounter: Secondary | ICD-10-CM | POA: Diagnosis not present

## 2021-12-11 DIAGNOSIS — M25512 Pain in left shoulder: Secondary | ICD-10-CM | POA: Diagnosis not present

## 2021-12-11 DIAGNOSIS — M25312 Other instability, left shoulder: Secondary | ICD-10-CM | POA: Diagnosis not present

## 2021-12-11 DIAGNOSIS — M25612 Stiffness of left shoulder, not elsewhere classified: Secondary | ICD-10-CM | POA: Diagnosis not present

## 2021-12-11 DIAGNOSIS — R531 Weakness: Secondary | ICD-10-CM | POA: Diagnosis not present

## 2021-12-12 DIAGNOSIS — R531 Weakness: Secondary | ICD-10-CM | POA: Diagnosis not present

## 2021-12-12 DIAGNOSIS — M25612 Stiffness of left shoulder, not elsewhere classified: Secondary | ICD-10-CM | POA: Diagnosis not present

## 2021-12-12 DIAGNOSIS — M25312 Other instability, left shoulder: Secondary | ICD-10-CM | POA: Diagnosis not present

## 2021-12-12 DIAGNOSIS — M25512 Pain in left shoulder: Secondary | ICD-10-CM | POA: Diagnosis not present

## 2021-12-20 DIAGNOSIS — R531 Weakness: Secondary | ICD-10-CM | POA: Diagnosis not present

## 2021-12-20 DIAGNOSIS — M25312 Other instability, left shoulder: Secondary | ICD-10-CM | POA: Diagnosis not present

## 2021-12-20 DIAGNOSIS — M25512 Pain in left shoulder: Secondary | ICD-10-CM | POA: Diagnosis not present

## 2021-12-20 DIAGNOSIS — M25612 Stiffness of left shoulder, not elsewhere classified: Secondary | ICD-10-CM | POA: Diagnosis not present

## 2021-12-21 DIAGNOSIS — M25612 Stiffness of left shoulder, not elsewhere classified: Secondary | ICD-10-CM | POA: Diagnosis not present

## 2021-12-21 DIAGNOSIS — M25512 Pain in left shoulder: Secondary | ICD-10-CM | POA: Diagnosis not present

## 2021-12-21 DIAGNOSIS — R531 Weakness: Secondary | ICD-10-CM | POA: Diagnosis not present

## 2021-12-21 DIAGNOSIS — M25312 Other instability, left shoulder: Secondary | ICD-10-CM | POA: Diagnosis not present

## 2021-12-25 DIAGNOSIS — R531 Weakness: Secondary | ICD-10-CM | POA: Diagnosis not present

## 2021-12-25 DIAGNOSIS — M25512 Pain in left shoulder: Secondary | ICD-10-CM | POA: Diagnosis not present

## 2021-12-25 DIAGNOSIS — M25612 Stiffness of left shoulder, not elsewhere classified: Secondary | ICD-10-CM | POA: Diagnosis not present

## 2021-12-25 DIAGNOSIS — M25312 Other instability, left shoulder: Secondary | ICD-10-CM | POA: Diagnosis not present

## 2021-12-26 DIAGNOSIS — S61213A Laceration without foreign body of left middle finger without damage to nail, initial encounter: Secondary | ICD-10-CM | POA: Diagnosis not present

## 2021-12-28 DIAGNOSIS — R531 Weakness: Secondary | ICD-10-CM | POA: Diagnosis not present

## 2021-12-28 DIAGNOSIS — M25312 Other instability, left shoulder: Secondary | ICD-10-CM | POA: Diagnosis not present

## 2021-12-28 DIAGNOSIS — M25512 Pain in left shoulder: Secondary | ICD-10-CM | POA: Diagnosis not present

## 2021-12-28 DIAGNOSIS — M25612 Stiffness of left shoulder, not elsewhere classified: Secondary | ICD-10-CM | POA: Diagnosis not present

## 2022-01-10 DIAGNOSIS — I10 Essential (primary) hypertension: Secondary | ICD-10-CM | POA: Diagnosis not present

## 2022-01-10 DIAGNOSIS — E785 Hyperlipidemia, unspecified: Secondary | ICD-10-CM | POA: Diagnosis not present

## 2022-01-10 DIAGNOSIS — Z131 Encounter for screening for diabetes mellitus: Secondary | ICD-10-CM | POA: Diagnosis not present

## 2022-01-11 LAB — COMPREHENSIVE METABOLIC PANEL
ALT: 17 IU/L (ref 0–44)
AST: 19 IU/L (ref 0–40)
Albumin/Globulin Ratio: 1.9 (ref 1.2–2.2)
Albumin: 4.5 g/dL (ref 3.9–4.9)
Alkaline Phosphatase: 109 IU/L (ref 44–121)
BUN/Creatinine Ratio: 6 — ABNORMAL LOW (ref 10–24)
BUN: 6 mg/dL — ABNORMAL LOW (ref 8–27)
Bilirubin Total: 0.8 mg/dL (ref 0.0–1.2)
CO2: 24 mmol/L (ref 20–29)
Calcium: 9.1 mg/dL (ref 8.6–10.2)
Chloride: 99 mmol/L (ref 96–106)
Creatinine, Ser: 0.99 mg/dL (ref 0.76–1.27)
Globulin, Total: 2.4 g/dL (ref 1.5–4.5)
Glucose: 82 mg/dL (ref 70–99)
Potassium: 3.4 mmol/L — ABNORMAL LOW (ref 3.5–5.2)
Sodium: 141 mmol/L (ref 134–144)
Total Protein: 6.9 g/dL (ref 6.0–8.5)
eGFR: 87 mL/min/{1.73_m2} (ref >59)

## 2022-01-11 LAB — LIPID PANEL
Chol/HDL Ratio: 3.9 ratio (ref 0.0–5.0)
Cholesterol, Total: 185 mg/dL (ref 100–199)
HDL: 47 mg/dL (ref >39)
LDL Chol Calc (NIH): 117 mg/dL — ABNORMAL HIGH (ref 0–99)
Triglycerides: 118 mg/dL (ref 0–149)
VLDL Cholesterol Cal: 21 mg/dL (ref 5–40)

## 2022-01-11 LAB — CBC
Hematocrit: 48.7 % (ref 37.5–51.0)
Hemoglobin: 16.9 g/dL (ref 13.0–17.7)
MCH: 28.6 pg (ref 26.6–33.0)
MCHC: 34.7 g/dL (ref 31.5–35.7)
MCV: 82 fL (ref 79–97)
Platelets: 343 10*3/uL (ref 150–450)
RBC: 5.91 x10E6/uL — ABNORMAL HIGH (ref 4.14–5.80)
RDW: 13.4 % (ref 11.6–15.4)
WBC: 8.5 10*3/uL (ref 3.4–10.8)

## 2022-01-11 LAB — HEMOGLOBIN A1C
Est. average glucose Bld gHb Est-mCnc: 111 mg/dL
Hgb A1c MFr Bld: 5.5 % (ref 4.8–5.6)

## 2022-01-15 ENCOUNTER — Telehealth: Payer: Self-pay | Admitting: *Deleted

## 2022-01-15 DIAGNOSIS — Z79899 Other long term (current) drug therapy: Secondary | ICD-10-CM

## 2022-01-15 DIAGNOSIS — I10 Essential (primary) hypertension: Secondary | ICD-10-CM

## 2022-01-15 DIAGNOSIS — E782 Mixed hyperlipidemia: Secondary | ICD-10-CM

## 2022-01-15 MED ORDER — POTASSIUM CHLORIDE CRYS ER 20 MEQ PO TBCR: 20.0000 meq | EXTENDED_RELEASE_TABLET | Freq: Two times a day (BID) | ORAL | 6 refills | Status: DC

## 2022-01-15 NOTE — Telephone Encounter (Signed)
-----   Message from Erma Heritage, [place] sent at 01/11/2022 10:48 AM EDT ----- Please let the patient know that his hemoglobin A1c is within a normal range at 5.5 meaning no diabetes. His hemoglobin and platelet count are also within a normal range. His LDL remains above goal as we prefer for this to be less than 70 and it is elevated to 117. If he has been taking Crestor 10 mg daily, would recommend titration of this to 20 mg daily. His kidney function and liver function are within normal limits but his potassium remains low at 3.4. If he has been taking K-dur 40 mEq daily, would increase to 60 mEq daily. Repeat BMET in 2-3 weeks and Lipid Panel in 3 months (if he is okay with increasing Crestor as discussed above).

## 2022-01-15 NOTE — Telephone Encounter (Signed)
-----   Message from Brittany M Strader, PA-C sent at 01/11/2022 10:48 AM EDT ----- Please let the patient know that his hemoglobin A1c is within a normal range at 5.5 meaning no diabetes. His hemoglobin and platelet count are also within a normal range. His LDL remains above goal as we prefer for this to be less than 70 and it is elevated to 117. If he has been taking Crestor 10 mg daily, would recommend titration of this to 20 mg daily. His kidney function and liver function are within normal limits but his potassium remains low at 3.4. If he has been taking K-dur 40 mEq daily, would increase to 60 mEq daily. Repeat BMET in 2-3 weeks and Lipid Panel in 3 months (if he is okay with increasing Crestor as discussed above). 

## 2022-01-15 NOTE — Telephone Encounter (Signed)
Spoke with pt who states that he has been taking over the counter potassium. In formed that we would call in the 40 meq potassium to the pharmacy. Pt agree to have BMET in 2-3 weeks and Lipids done in 3 months. He does not wish to increase Crestor at this time but will try to take on a daily basis.

## 2022-01-17 ENCOUNTER — Telehealth: Payer: Self-pay | Admitting: *Deleted

## 2022-01-17 NOTE — Telephone Encounter (Signed)
Pt notified that he needs to have lab work done in 2 weeks.

## 2022-01-17 NOTE — Telephone Encounter (Signed)
Pt notified of plan of care and he agrees.

## 2022-01-17 NOTE — Telephone Encounter (Signed)
Patient returned LPN's call. 

## 2022-01-23 DIAGNOSIS — M25542 Pain in joints of left hand: Secondary | ICD-10-CM | POA: Diagnosis not present

## 2022-01-23 DIAGNOSIS — S61213A Laceration without foreign body of left middle finger without damage to nail, initial encounter: Secondary | ICD-10-CM | POA: Diagnosis not present

## 2022-01-23 DIAGNOSIS — R531 Weakness: Secondary | ICD-10-CM | POA: Diagnosis not present

## 2022-01-23 DIAGNOSIS — M25642 Stiffness of left hand, not elsewhere classified: Secondary | ICD-10-CM | POA: Diagnosis not present

## 2022-01-23 DIAGNOSIS — S61213D Laceration without foreign body of left middle finger without damage to nail, subsequent encounter: Secondary | ICD-10-CM | POA: Diagnosis not present

## 2022-01-25 IMAGING — CT CT HEART MORP W/ CTA COR W/ SCORE W/ CA W/CM &/OR W/O CM
4 of 7 series · 8 of 20 positions shown, 9 images · IV contrast (APPLIED)
Comparison: None.
COMPARISON: None.

Addendum:
EXAM:
OVER-READ INTERPRETATION  CT CHEST

The following report is an over-read performed by radiologist Dr.
Hristiqn Vabre [REDACTED] on 04/30/2021. This over-read
does not include interpretation of cardiac or coronary anatomy or
pathology. The coronary CTA interpretation by the cardiologist is
attached.
TECHNIQUE: The patient was scanned on a Phillips Force scanner.

[Series 6: best diast · axial · 0.43mm/px · z∈[-291,-252]mm · 2 of 291 slices shown, 3 images]
[im 97/291  vessel]
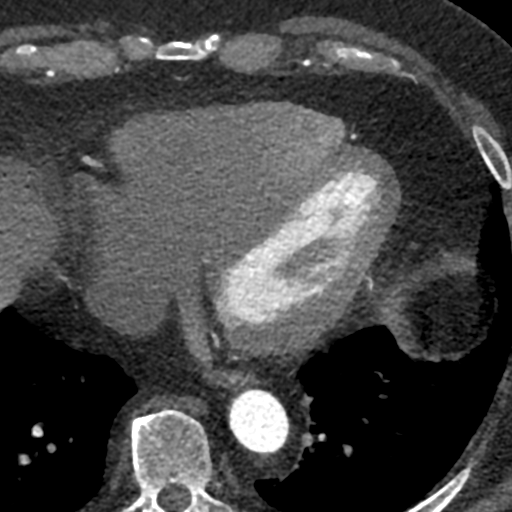
[im 97/291  lung]
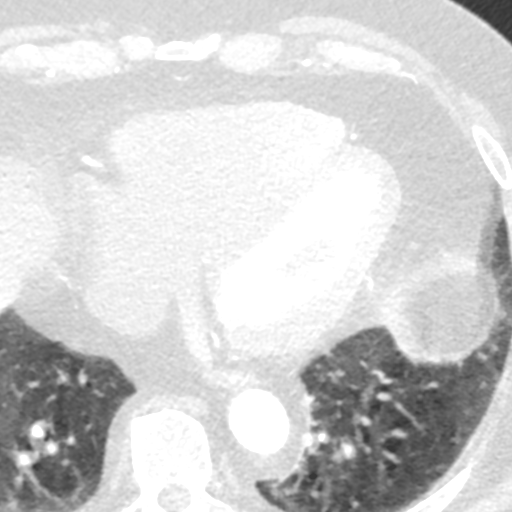
[im 194/291  vessel]
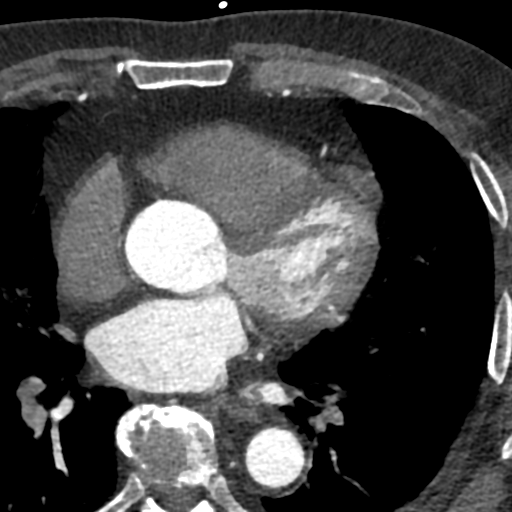

[Series 7: best syst · axial · 0.43mm/px · z∈[-291,-252]mm · 2 of 291 slices shown]
[im 97/291  vessel]
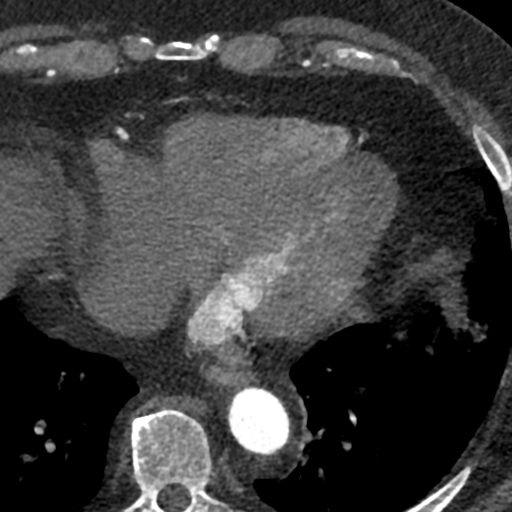
[im 194/291  vessel]
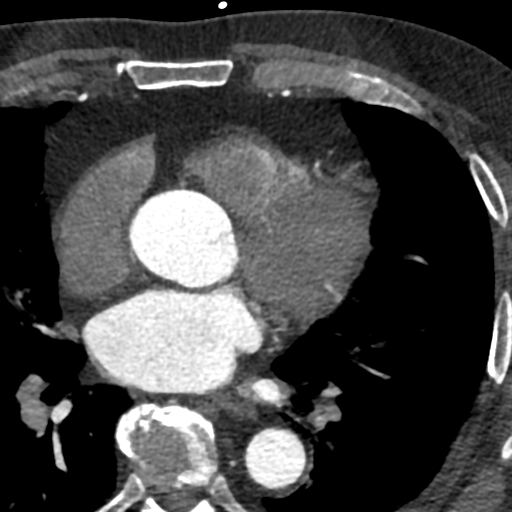

[Series 8: ts diast sharp · axial · 0.43mm/px · z∈[-291,-252]mm · 2 of 291 slices shown]
[im 97/291  lung]
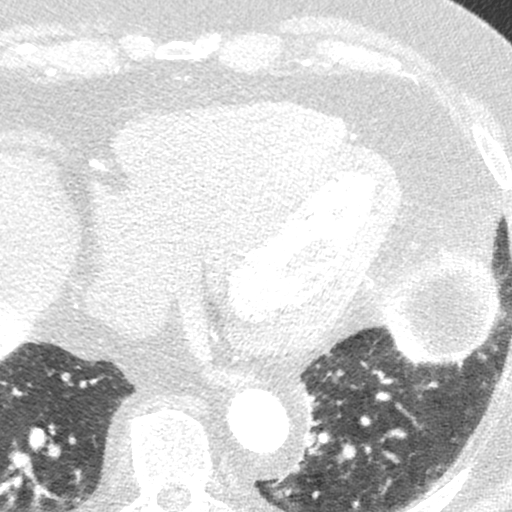
[im 194/291  lung]
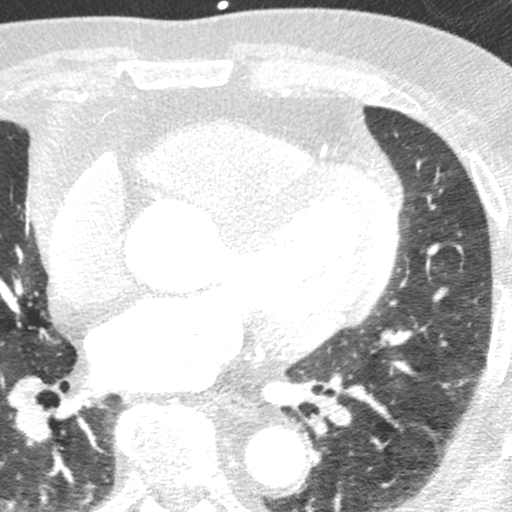

[Series 9: ts syst sharp · axial · 0.43mm/px · z∈[-291,-252]mm · 2 of 291 slices shown]
[im 97/291  lung]
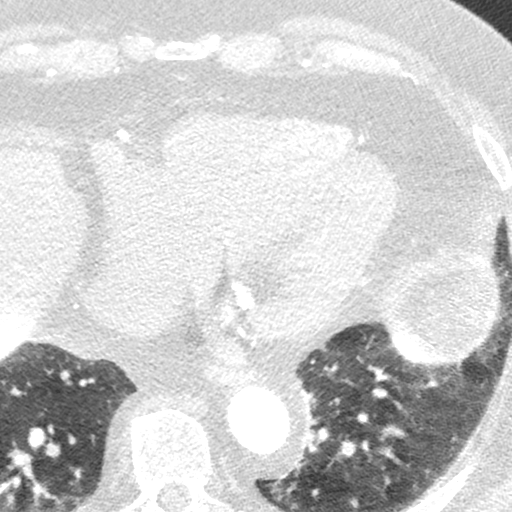
[im 194/291  lung]
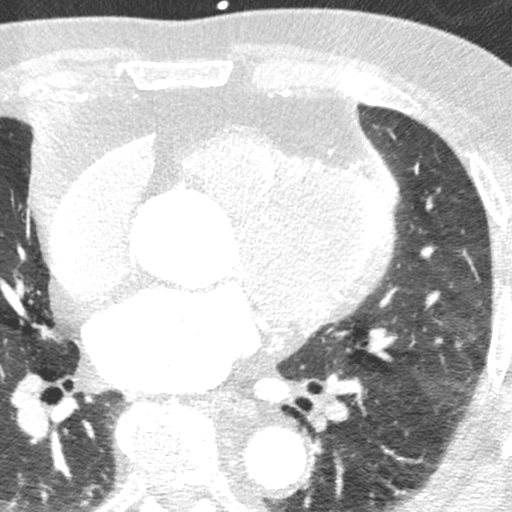

[8 of 20 positions shown; findings below may reference images not displayed]

FINDINGS: Vascular: Heart is normal size. Ascending thoracic aorta upper
limits normal in caliber at 3.9 cm.

Mediastinum/Nodes: No adenopathy

Lungs/Pleura: No confluent opacities or effusions.

Upper Abdomen: 2.8 cm cyst in the left hepatic lobe/stone. No acute
findings

Musculoskeletal: Chest wall soft tissues are unremarkable. No acute
bony abnormality.
IMPRESSION: No acute extra cardiac abnormality.

EXAM:
Cardiac/Coronary  CT
FINDINGS: A 120 kV prospective scan was triggered in the descending thoracic
aorta at 111 HU's. Axial non-contrast 3 mm slices were carried out
through the heart. The data set was analyzed on a dedicated work
station and scored using the Agatson method. Gantry rotation speed
was 250 msecs and collimation was .6 mm. No beta blockade and 0.8 mg
of sl NTG was given. The 3D data set was reconstructed in 5%
intervals of the 67-82 % of the R-R cycle. Diastolic phases were
analyzed on a dedicated work station using MPR, MIP and VRT modes.
The patient received 80 cc of contrast.

Aorta: Ascending aorta mildly dilated. 4.1 cm. No calcifications. No
dissection.

Aortic Valve:  Trileaflet.  No calcifications.

Coronary Arteries:  Normal coronary origin.  Right dominance.

RCA is a large dominant artery that gives rise to PDA and PLVB.
There is minimal (<25%) soft plaque in the mid RCA.

Left main is a large artery that gives rise to LAD, RI, and LCX
arteries.

LAD is a large vessel that has minimal (<25%) calcified plaque at
the ostium and in the mid LAD.

RI has no plaque.

LCX is a non-dominant artery that gives rise to three OM

Branches.   There is no plaque.

Coronary Calcium Score:

Left main: 0

Left anterior descending artery:

Left circumflex artery: 0

Right coronary artery:

Total:

Percentile: 52nd

Other findings:

Normal pulmonary vein drainage into the left atrium.

Normal let atrial appendage without a thrombus.

Normal size of the pulmonary artery.

Misregistration artifact.

Small PFO with left-to-right flow.
IMPRESSION: 1. Coronary calcium score of 42.6. This was 52nd percentile for
age-, race-, and sex-matched controls.

2. Normal coronary origin with right dominance.

3. There is minimal (<25%) plaque in the LAD and RCA.  CAD-RADS 1.

4.  Ascending aorta mildly dilated.  4.1 cm.

*** End of Addendum ***
EXAM:
OVER-READ INTERPRETATION  CT CHEST

The following report is an over-read performed by radiologist Dr.
Hristiqn Vabre [REDACTED] on 04/30/2021. This over-read
does not include interpretation of cardiac or coronary anatomy or
pathology. The coronary CTA interpretation by the cardiologist is
attached.
FINDINGS: Vascular: Heart is normal size. Ascending thoracic aorta upper
limits normal in caliber at 3.9 cm.

Mediastinum/Nodes: No adenopathy

Lungs/Pleura: No confluent opacities or effusions.

Upper Abdomen: 2.8 cm cyst in the left hepatic lobe/stone. No acute
findings

Musculoskeletal: Chest wall soft tissues are unremarkable. No acute
bony abnormality.
IMPRESSION: No acute extra cardiac abnormality.

## 2022-01-30 DIAGNOSIS — S61213A Laceration without foreign body of left middle finger without damage to nail, initial encounter: Secondary | ICD-10-CM | POA: Diagnosis not present

## 2022-01-30 DIAGNOSIS — M25642 Stiffness of left hand, not elsewhere classified: Secondary | ICD-10-CM | POA: Diagnosis not present

## 2022-01-30 DIAGNOSIS — S61213D Laceration without foreign body of left middle finger without damage to nail, subsequent encounter: Secondary | ICD-10-CM | POA: Diagnosis not present

## 2022-01-30 DIAGNOSIS — R531 Weakness: Secondary | ICD-10-CM | POA: Diagnosis not present

## 2022-01-30 DIAGNOSIS — M25542 Pain in joints of left hand: Secondary | ICD-10-CM | POA: Diagnosis not present

## 2022-02-05 DIAGNOSIS — S61213A Laceration without foreign body of left middle finger without damage to nail, initial encounter: Secondary | ICD-10-CM | POA: Diagnosis not present

## 2022-02-05 DIAGNOSIS — M25542 Pain in joints of left hand: Secondary | ICD-10-CM | POA: Diagnosis not present

## 2022-02-05 DIAGNOSIS — R531 Weakness: Secondary | ICD-10-CM | POA: Diagnosis not present

## 2022-02-05 DIAGNOSIS — S61213D Laceration without foreign body of left middle finger without damage to nail, subsequent encounter: Secondary | ICD-10-CM | POA: Diagnosis not present

## 2022-02-05 DIAGNOSIS — M25642 Stiffness of left hand, not elsewhere classified: Secondary | ICD-10-CM | POA: Diagnosis not present

## 2022-02-14 DIAGNOSIS — R531 Weakness: Secondary | ICD-10-CM | POA: Diagnosis not present

## 2022-02-14 DIAGNOSIS — S61213D Laceration without foreign body of left middle finger without damage to nail, subsequent encounter: Secondary | ICD-10-CM | POA: Diagnosis not present

## 2022-02-14 DIAGNOSIS — M25642 Stiffness of left hand, not elsewhere classified: Secondary | ICD-10-CM | POA: Diagnosis not present

## 2022-02-14 DIAGNOSIS — M25542 Pain in joints of left hand: Secondary | ICD-10-CM | POA: Diagnosis not present

## 2022-02-21 DIAGNOSIS — R531 Weakness: Secondary | ICD-10-CM | POA: Diagnosis not present

## 2022-02-21 DIAGNOSIS — M25542 Pain in joints of left hand: Secondary | ICD-10-CM | POA: Diagnosis not present

## 2022-02-21 DIAGNOSIS — M25642 Stiffness of left hand, not elsewhere classified: Secondary | ICD-10-CM | POA: Diagnosis not present

## 2022-02-21 DIAGNOSIS — S61213D Laceration without foreign body of left middle finger without damage to nail, subsequent encounter: Secondary | ICD-10-CM | POA: Diagnosis not present

## 2022-02-28 DIAGNOSIS — M25642 Stiffness of left hand, not elsewhere classified: Secondary | ICD-10-CM | POA: Diagnosis not present

## 2022-02-28 DIAGNOSIS — R531 Weakness: Secondary | ICD-10-CM | POA: Diagnosis not present

## 2022-02-28 DIAGNOSIS — M25542 Pain in joints of left hand: Secondary | ICD-10-CM | POA: Diagnosis not present

## 2022-02-28 DIAGNOSIS — S61213D Laceration without foreign body of left middle finger without damage to nail, subsequent encounter: Secondary | ICD-10-CM | POA: Diagnosis not present

## 2022-03-07 DIAGNOSIS — S61213D Laceration without foreign body of left middle finger without damage to nail, subsequent encounter: Secondary | ICD-10-CM | POA: Diagnosis not present

## 2022-03-07 DIAGNOSIS — M25642 Stiffness of left hand, not elsewhere classified: Secondary | ICD-10-CM | POA: Diagnosis not present

## 2022-03-07 DIAGNOSIS — R531 Weakness: Secondary | ICD-10-CM | POA: Diagnosis not present

## 2022-03-07 DIAGNOSIS — M25542 Pain in joints of left hand: Secondary | ICD-10-CM | POA: Diagnosis not present

## 2022-03-11 DIAGNOSIS — S61213D Laceration without foreign body of left middle finger without damage to nail, subsequent encounter: Secondary | ICD-10-CM | POA: Diagnosis not present

## 2022-03-11 DIAGNOSIS — M25642 Stiffness of left hand, not elsewhere classified: Secondary | ICD-10-CM | POA: Diagnosis not present

## 2022-03-11 DIAGNOSIS — M25542 Pain in joints of left hand: Secondary | ICD-10-CM | POA: Diagnosis not present

## 2022-03-11 DIAGNOSIS — R531 Weakness: Secondary | ICD-10-CM | POA: Diagnosis not present

## 2022-03-21 DIAGNOSIS — M25642 Stiffness of left hand, not elsewhere classified: Secondary | ICD-10-CM | POA: Diagnosis not present

## 2022-03-21 DIAGNOSIS — M25542 Pain in joints of left hand: Secondary | ICD-10-CM | POA: Diagnosis not present

## 2022-03-21 DIAGNOSIS — S61213D Laceration without foreign body of left middle finger without damage to nail, subsequent encounter: Secondary | ICD-10-CM | POA: Diagnosis not present

## 2022-03-21 DIAGNOSIS — R531 Weakness: Secondary | ICD-10-CM | POA: Diagnosis not present

## 2022-03-28 DIAGNOSIS — S61213D Laceration without foreign body of left middle finger without damage to nail, subsequent encounter: Secondary | ICD-10-CM | POA: Diagnosis not present

## 2022-03-28 DIAGNOSIS — M25642 Stiffness of left hand, not elsewhere classified: Secondary | ICD-10-CM | POA: Diagnosis not present

## 2022-03-28 DIAGNOSIS — R531 Weakness: Secondary | ICD-10-CM | POA: Diagnosis not present

## 2022-03-28 DIAGNOSIS — M25542 Pain in joints of left hand: Secondary | ICD-10-CM | POA: Diagnosis not present

## 2022-04-17 ENCOUNTER — Telehealth: Payer: Self-pay | Admitting: Physician Assistant

## 2022-04-17 NOTE — Telephone Encounter (Signed)
Left a message for patient to call office back .  

## 2022-04-17 NOTE — Telephone Encounter (Signed)
Covering [place]. Received message re: expiring orders for BMET. Per 12/2021 phone notes, [place] gave guidance re: potassium due to low level, recommended recheck at that time which he has not come in for. (Per 9/26: "Repeat BMET in 2-3 weeks and Lipid Panel in 3 months (if he is okay with increasing Crestor as discussed above)." He would now be due for the lipid panel as well- if he did make changes to his cholesterol medicine as outlined in that phone note, would get a CMET instead of BMET. Please remind him of need for bloodwork.

## 2022-04-18 ENCOUNTER — Telehealth: Payer: Self-pay

## 2022-04-18 DIAGNOSIS — I77810 Thoracic aortic ectasia: Secondary | ICD-10-CM

## 2022-04-18 NOTE — Telephone Encounter (Addendum)
Ellsworth Lennox, PA-C 05/01/2021  2:30 PM EST     Please let the patient know that his Coronary CT was very reassuring and showed minimal plaque along his coronary arteries with less than 25% stenosis. He is already on Crestor 10 mg daily and would remain on this with goal LDL less than 70 to prevent plaque progression. He did have mild dilation of the ascending aorta at 4.1 cm and would recommend repeat imaging in 1 year for follow-up of this.      04/18/22 left message to return call, needs to schedule CT

## 2022-04-18 NOTE — Telephone Encounter (Signed)
-----   Message from Nori Riis, RN sent at 05/01/2021  4:35 PM EST ----- Regarding: yrly chest ct for ascend aorta 4.1

## 2022-04-18 NOTE — Telephone Encounter (Signed)
Left a message for patient to call office back .  

## 2022-04-19 NOTE — Telephone Encounter (Signed)
Left a message for patient to call office back .  

## 2022-04-19 NOTE — Telephone Encounter (Signed)
Covering Brittany's inbox .There was an order to [place] for [place] of aorta. Per chart review, patient had cor CT 04/2021 that showed mildly dilated ascending aorta. She recommended f/u in 1 year. An US of the aorta would only look at the abdominal aorta, not ascending aorta. Therefore would pursue CT Angio Chest Aorta W WO Contrast as the preferred study, but needs Cr checked beforehand. See also phone note 12/27, patient was contacted for overdue labs, so please remind him if you get him on the phone about this as well. Thank you!

## 2022-04-23 NOTE — Telephone Encounter (Signed)
Left message to return call 

## 2022-04-25 NOTE — Telephone Encounter (Signed)
Mailed letter to patient

## 2022-04-30 ENCOUNTER — Telehealth: Payer: Self-pay | Admitting: Physician Assistant

## 2022-04-30 NOTE — Telephone Encounter (Signed)
04/30/2022 CALLED PATIENT - No Answer - needs to have CT ANGIO CHEST AORTA W &/OR WO CONTRAST schedujled at [facility]. ordered by Melina Copa, PA

## 2022-05-24 ENCOUNTER — Other Ambulatory Visit: Payer: Self-pay

## 2022-05-24 DIAGNOSIS — Z79899 Other long term (current) drug therapy: Secondary | ICD-10-CM

## 2022-05-24 DIAGNOSIS — I712 Thoracic aortic aneurysm, without rupture, unspecified: Secondary | ICD-10-CM

## 2022-05-24 DIAGNOSIS — E782 Mixed hyperlipidemia: Secondary | ICD-10-CM

## 2022-05-24 DIAGNOSIS — Z01818 Encounter for other preprocedural examination: Secondary | ICD-10-CM

## 2022-05-27 ENCOUNTER — Other Ambulatory Visit: Payer: Self-pay

## 2022-05-27 DIAGNOSIS — R739 Hyperglycemia, unspecified: Secondary | ICD-10-CM

## 2022-06-14 ENCOUNTER — Other Ambulatory Visit (HOSPITAL_COMMUNITY)
Admission: RE | Admit: 2022-06-14 | Discharge: 2022-06-14 | Disposition: A | Discharge status: Home / Self Care | Payer: Federal, State, Local not specified - PPO | Source: Ambulatory Visit | Attending: Physician Assistant | Admitting: Physician Assistant

## 2022-06-14 DIAGNOSIS — I712 Thoracic aortic aneurysm, without rupture, unspecified: Secondary | ICD-10-CM | POA: Diagnosis not present

## 2022-06-14 DIAGNOSIS — Z79899 Other long term (current) drug therapy: Secondary | ICD-10-CM | POA: Diagnosis not present

## 2022-06-14 DIAGNOSIS — E782 Mixed hyperlipidemia: Secondary | ICD-10-CM

## 2022-06-14 DIAGNOSIS — R739 Hyperglycemia, unspecified: Secondary | ICD-10-CM

## 2022-06-14 LAB — COMPREHENSIVE METABOLIC PANEL
ALT: 21 U/L (ref 0–44)
AST: 22 U/L (ref 15–41)
Albumin: 4 g/dL (ref 3.5–5.0)
Alkaline Phosphatase: 77 U/L (ref 38–126)
Anion gap: 7 (ref 5–15)
BUN: 10 mg/dL (ref 8–23)
CO2: 30 mmol/L (ref 22–32)
Calcium: 8.7 mg/dL — ABNORMAL LOW (ref 8.9–10.3)
Chloride: 103 mmol/L (ref 98–111)
Creatinine, Ser: 1.03 mg/dL (ref 0.61–1.24)
GFR, Estimated: >60 mL/min (ref >60)
Glucose, Bld: 96 mg/dL (ref 70–99)
Potassium: 3.7 mmol/L (ref 3.5–5.1)
Sodium: 140 mmol/L (ref 135–145)
Total Bilirubin: 1 mg/dL (ref 0.3–1.2)
Total Protein: 6.9 g/dL (ref 6.5–8.1)

## 2022-06-14 LAB — CBC
HCT: 51.4 % (ref 39.0–52.0)
Hemoglobin: 17.1 g/dL — ABNORMAL HIGH (ref 13.0–17.0)
MCH: 28.6 pg (ref 26.0–34.0)
MCHC: 33.3 g/dL (ref 30.0–36.0)
MCV: 86.1 fL (ref 80.0–100.0)
Platelets: 298 10*3/uL (ref 150–400)
RBC: 5.97 MIL/uL — ABNORMAL HIGH (ref 4.22–5.81)
RDW: 13.3 % (ref 11.5–15.5)
WBC: 8 10*3/uL (ref 4.0–10.5)
nRBC: 0 % (ref 0.0–0.2)

## 2022-06-14 LAB — LIPID PANEL
Cholesterol: 181 mg/dL (ref 0–200)
HDL: 46 mg/dL (ref >40)
LDL Cholesterol: 120 mg/dL — ABNORMAL HIGH (ref 0–99)
Total CHOL/HDL Ratio: 3.9 RATIO
Triglycerides: 75 mg/dL (ref <150)
VLDL: 15 mg/dL (ref 0–40)

## 2022-06-14 LAB — HEMOGLOBIN A1C
Hgb A1c MFr Bld: 5.2 % (ref 4.8–5.6)
Mean Plasma Glucose: 102.54 mg/dL

## 2022-06-26 ENCOUNTER — Ambulatory Visit (HOSPITAL_COMMUNITY)
Admission: RE | Admit: 2022-06-26 | Discharge: 2022-06-26 | Disposition: A | Discharge status: Home / Self Care | Payer: Federal, State, Local not specified - PPO | Source: Ambulatory Visit | Attending: Physician Assistant | Admitting: Physician Assistant

## 2022-06-26 DIAGNOSIS — I77810 Thoracic aortic ectasia: Secondary | ICD-10-CM | POA: Diagnosis not present

## 2022-06-26 DIAGNOSIS — I7121 Aneurysm of the ascending aorta, without rupture: Secondary | ICD-10-CM | POA: Diagnosis not present

## 2022-06-26 MED ORDER — IOHEXOL 350 MG/ML SOLN
75.0000 mL | Freq: Once | INTRAVENOUS | Status: AC | PRN
  Administered 2022-06-26: 75 mL via INTRAVENOUS

## 2022-06-28 ENCOUNTER — Telehealth: Payer: Self-pay

## 2022-06-28 NOTE — Telephone Encounter (Signed)
-----   Message from Jeanann Lewandowsky, [place] sent at 06/28/2022  9:55 AM EST -----  ----- Message ----- From: Jeanann Lewandowsky, RMA Sent: 06/28/2022   6:23 AM EST To: [facility] Triage    ----- Message ----- From: Almyra Deforest, PA Sent: 06/27/2022   5:02 PM EST To: Jeanann Lewandowsky, RMA; Jacqulynn Cadet, CMA  Covering for Melina Copa and [place]. No significant change in the diameter of the thoracic aorta when compare to Jan 2023 CT scan. Back in Jan 2023, coronary CT showed dilated thoracic aorta measuring 4.1 cm, now it is 4.2 cm. Recommend repeat CTA of chest aorta in 12 month.

## 2022-06-28 NOTE — Telephone Encounter (Signed)
Patient notified and verbalized understanding. Patient had no questions or concern at this time. PCP copied.  

## 2022-07-09 ENCOUNTER — Other Ambulatory Visit (HOSPITAL_COMMUNITY): Payer: Federal, State, Local not specified - PPO

## 2022-08-14 ENCOUNTER — Ambulatory Visit: Payer: Federal, State, Local not specified - PPO | Admitting: Student

## 2022-08-23 DIAGNOSIS — J069 Acute upper respiratory infection, unspecified: Secondary | ICD-10-CM | POA: Diagnosis not present

## 2022-08-23 DIAGNOSIS — Z6838 Body mass index (BMI) 38.0-38.9, adult: Secondary | ICD-10-CM | POA: Diagnosis not present

## 2022-08-23 DIAGNOSIS — E6609 Other obesity due to excess calories: Secondary | ICD-10-CM | POA: Diagnosis not present

## 2022-08-23 DIAGNOSIS — I10 Essential (primary) hypertension: Secondary | ICD-10-CM | POA: Diagnosis not present

## 2022-09-03 DIAGNOSIS — I10 Essential (primary) hypertension: Secondary | ICD-10-CM | POA: Diagnosis not present

## 2022-09-03 DIAGNOSIS — R0902 Hypoxemia: Secondary | ICD-10-CM | POA: Diagnosis not present

## 2022-09-06 ENCOUNTER — Ambulatory Visit: Payer: Federal, State, Local not specified - PPO | Attending: Student | Admitting: Student

## 2022-09-06 ENCOUNTER — Encounter: Payer: Self-pay | Admitting: Student

## 2022-09-06 VITALS — BP 148/82 | HR 93 | Ht 70.0 in | Wt 254.0 lb

## 2022-09-06 DIAGNOSIS — I2510 Atherosclerotic heart disease of native coronary artery without angina pectoris: Secondary | ICD-10-CM

## 2022-09-06 DIAGNOSIS — I10 Essential (primary) hypertension: Secondary | ICD-10-CM | POA: Diagnosis not present

## 2022-09-06 DIAGNOSIS — R531 Weakness: Secondary | ICD-10-CM

## 2022-09-06 DIAGNOSIS — I7121 Aneurysm of the ascending aorta, without rupture: Secondary | ICD-10-CM

## 2022-09-06 DIAGNOSIS — E785 Hyperlipidemia, unspecified: Secondary | ICD-10-CM | POA: Diagnosis not present

## 2022-09-06 MED ORDER — AMLODIPINE BESYLATE 10 MG PO TABS: 10.0000 mg | ORAL_TABLET | Freq: Every day | ORAL | 3 refills | Status: AC

## 2022-09-06 NOTE — Progress Notes (Signed)
Cardiology Office Note    Date:  09/06/2022  ID:  [name], DOB Feb 19, 1961, MRN [id number] Cardiologist: Dina Rich, MD    History of Present Illness:    [name] is a 62 y.o. male with past medical history of CAD (s/p Coronary CT 04/2021 showing minimal CAD along LAD and RCA), dilated ascending aorta (at 4.1 cm in 04/2021), HTN, HLD and OSA (intolerant to CPAP) who presents to the office today for annual follow-up.   He was examined by myself in 08/2021 and denied any recent anginal symptoms at that time. BP was elevated during his visit and he was continued on Losartan 100mg  daily with Amlodipine 5mg  daily being added to his regimen.   He did have a follow-up CTA in 06/2022 which showed his ascending thoracic aortic aneurysm was overall stable at 4.2 cm with annual imaging recommended. He also had follow-up labs in the interim which showed WBC 8.0, Hgb 17.1, platelets 298, Na+ 140, K+ 3.7 and creatinine 1.03. AST and ALT normal. Hemoglobin A1c was normal at 5.2. FLP did show total cholesterol 181, triglycerides 75, HDL 46 and LDL 120 and was recommend to increase to Crestor to 20 mg daily and he reported not taking Crestor daily and he did want to hold off on increasing the dose.  In talking with the patient today, he reports overall doing well until earlier this week when he experienced an episode of numbness and tingling along his left arm, left shoulder and the left side of his face. He called EMS and was checked at that time and did not require transport.  Reports symptoms lasted for approximately 24 hours and then resolved. He does have chronic paresthesias along his lower extremities for 10 + years and was recently referred to Neurosurgery by his PCP. Denies any associated blurred vision, headaches, slurred speech or facial droop at the time of his episode this week. He denies any recent exertional chest pain.  No specific orthopnea, PND or pitting edema.  His blood  pressure is elevated today and was initially recorded at 154/102, rechecked and at 148/82.   Studies Reviewed:   EKG: EKG is ordered today and demonstrates normal sinus rhythm, heart rate 93 with no acute ST abnormalities.  Coronary CT: 04/2021 Aorta: Ascending aorta mildly dilated. 4.1 cm. No calcifications. No dissection.   Aortic Valve:  Trileaflet.  No calcifications.   Coronary Arteries:  Normal coronary origin.  Right dominance.   RCA is a large dominant artery that gives rise to PDA and PLVB. There is minimal (<25%) soft plaque in the mid RCA.   Left main is a large artery that gives rise to LAD, RI, and LCX arteries.   LAD is a large vessel that has minimal (<25%) calcified plaque at the ostium and in the mid LAD.   RI has no plaque.   LCX is a non-dominant artery that gives rise to three OM   Branches.   There is no plaque.   Coronary Calcium Score:   Left main: 0   Left anterior descending artery: 42.2   Left circumflex artery: 0   Right coronary artery: 0.458   Total: 42.6   Percentile: 52nd   Other findings:   Normal pulmonary vein drainage into the left atrium.   Normal let atrial appendage without a thrombus.   Normal size of the pulmonary artery.   Misregistration artifact.   Small PFO with left-to-right flow.   IMPRESSION: 1. Coronary calcium score of  42.6. This was 52nd percentile for age-, race-, and sex-matched controls.   2. Normal coronary origin with right dominance.   3. There is minimal (<25%) plaque in the LAD and RCA.  CAD-RADS 1.   4.  Ascending aorta mildly dilated.  4.1 cm.    CTA: 06/2022 IMPRESSION: Grossly stable 4.2 cm ascending thoracic aortic aneurysm. Recommend annual imaging followup by CTA or MRA. This recommendation follows 2010 ACCF/AHA/AATS/ACR/ASA/SCA/SCAI/SIR/STS/SVM Guidelines for the Diagnosis and Management of Patients with Thoracic Aortic Disease. Circulation. 2010; 121: Z610-R604. Aortic aneurysm  NOS   Physical Exam:   VS:  BP (!) 148/82   Pulse 93   Ht 5\' 10"  (1.778 m)   Wt 254 lb (115.2 kg)   SpO2 95%   BMI 36.45 kg/m    Wt Readings from Last 3 Encounters:  09/06/22 254 lb (115.2 kg)  09/19/21 251 lb (113.9 kg)  04/12/21 245 lb (111.1 kg)     GEN: Well nourished, well developed male appearing in no acute distress NECK: No JVD; No carotid bruits CARDIAC: RRR, no murmurs, rubs, gallops RESPIRATORY:  Clear to auscultation without rales, wheezing or rhonchi  ABDOMEN: Appears non-distended. No obvious abdominal masses. EXTREMITIES: No clubbing or cyanosis. No pitting edema.  Distal pedal pulses are 2+ bilaterally. Strength 5/5 along upper and lower extremities bilaterally.    Assessment and Plan:   1. CAD - Prior Coronary CT in 04/2021 showed minimal CAD as discussed above. At this time, will continue with risk factor modification. He remains on Crestor 10 mg daily.  2. HTN - His blood pressure has been elevated when checked at home and is elevated at 148/82 during today's visit. He is currently on Amlodipine 5 mg daily and Olmesartan 40 mg daily. Given his elevated readings, will titrate Amlodipine to 10 mg daily.  If he develops lower extremity edema with this, would consider the use of Chlorthalidone or Coreg. He has started making dietary changes as well and we reviewed that reducing his sodium intake and weight loss will also help with his BP.   3. HLD - FLP in 05/2022 showed total cholesterol 181, triglycerides 75, HDL 46 and LDL 120. He was not taking Crestor daily at that time and if a repeat FLP is not obtained by his PCP in the interim, would recheck at his next office visit. Goal LDL would be less than 70 given prior coronary calcification. He is listed as being on Niacin but confirmed with the patient he is not longer taking this.   4. Ascending Aortic Aneurysm - This was at 4.1 cm in 04/2021 and at 4.2 cm by most recent imaging in 06/2022. Would plan for  follow-up imaging next year.  5. Left-Sided Weakness - He recently experienced numbness along his left upper and lower extremities along with the left side of his face earlier this week and was evaluated by EMS and did not proceed to the ED for further evaluation. No residual deficits at this time. Will obtain a Head CT for further evaluation and if abnormal, would refer back to Neurology.  Signed, Ellsworth Lennox, PA-C

## 2022-09-06 NOTE — Patient Instructions (Signed)
Medication Instructions:   INCREASE Amlodipine to 10 mg daily   Labwork: None today  Testing/Procedures: Non-Contrast Head CT  Follow-Up: 1 year  Any Other Special Instructions Will Be Listed Below (If Applicable).   Keep daily blood pressure log and drop off in 1 month  If you need a refill on your cardiac medications before your next appointment, please call your pharmacy.

## 2022-09-19 DIAGNOSIS — M549 Dorsalgia, unspecified: Secondary | ICD-10-CM | POA: Diagnosis not present

## 2022-10-02 DIAGNOSIS — M65322 Trigger finger, left index finger: Secondary | ICD-10-CM | POA: Diagnosis not present

## 2022-10-02 DIAGNOSIS — M65332 Trigger finger, left middle finger: Secondary | ICD-10-CM | POA: Diagnosis not present

## 2022-10-02 DIAGNOSIS — M79641 Pain in right hand: Secondary | ICD-10-CM | POA: Diagnosis not present

## 2022-10-15 ENCOUNTER — Ambulatory Visit (HOSPITAL_COMMUNITY): Payer: Federal, State, Local not specified - PPO

## 2022-10-18 ENCOUNTER — Ambulatory Visit (HOSPITAL_COMMUNITY)
Admission: RE | Admit: 2022-10-18 | Discharge: 2022-10-18 | Disposition: A | Discharge status: Home / Self Care | Payer: Federal, State, Local not specified - PPO | Source: Ambulatory Visit | Attending: Student | Admitting: Student

## 2022-10-18 DIAGNOSIS — R531 Weakness: Secondary | ICD-10-CM | POA: Diagnosis not present

## 2022-12-27 ENCOUNTER — Other Ambulatory Visit: Payer: Self-pay | Admitting: Neurological Surgery

## 2022-12-27 DIAGNOSIS — M48061 Spinal stenosis, lumbar region without neurogenic claudication: Secondary | ICD-10-CM | POA: Diagnosis not present

## 2022-12-27 DIAGNOSIS — Z6837 Body mass index (BMI) 37.0-37.9, adult: Secondary | ICD-10-CM | POA: Diagnosis not present

## 2023-01-02 DIAGNOSIS — Z6837 Body mass index (BMI) 37.0-37.9, adult: Secondary | ICD-10-CM | POA: Diagnosis not present

## 2023-01-02 DIAGNOSIS — R7309 Other abnormal glucose: Secondary | ICD-10-CM | POA: Diagnosis not present

## 2023-01-02 DIAGNOSIS — Z23 Encounter for immunization: Secondary | ICD-10-CM | POA: Diagnosis not present

## 2023-01-02 DIAGNOSIS — E782 Mixed hyperlipidemia: Secondary | ICD-10-CM | POA: Diagnosis not present

## 2023-01-02 DIAGNOSIS — E7849 Other hyperlipidemia: Secondary | ICD-10-CM | POA: Diagnosis not present

## 2023-01-02 DIAGNOSIS — E6609 Other obesity due to excess calories: Secondary | ICD-10-CM | POA: Diagnosis not present

## 2023-01-02 DIAGNOSIS — G4730 Sleep apnea, unspecified: Secondary | ICD-10-CM | POA: Diagnosis not present

## 2023-01-02 DIAGNOSIS — E559 Vitamin D deficiency, unspecified: Secondary | ICD-10-CM | POA: Diagnosis not present

## 2023-01-02 DIAGNOSIS — Z0000 Encounter for general adult medical examination without abnormal findings: Secondary | ICD-10-CM | POA: Diagnosis not present

## 2023-01-02 DIAGNOSIS — Z1331 Encounter for screening for depression: Secondary | ICD-10-CM | POA: Diagnosis not present

## 2023-01-27 ENCOUNTER — Ambulatory Visit
Admission: RE | Admit: 2023-01-27 | Discharge: 2023-01-27 | Disposition: A | Discharge status: Home / Self Care | Payer: Federal, State, Local not specified - PPO | Source: Ambulatory Visit | Attending: Neurological Surgery | Admitting: Neurological Surgery

## 2023-01-27 DIAGNOSIS — M5126 Other intervertebral disc displacement, lumbar region: Secondary | ICD-10-CM | POA: Diagnosis not present

## 2023-01-27 DIAGNOSIS — M48061 Spinal stenosis, lumbar region without neurogenic claudication: Secondary | ICD-10-CM

## 2023-01-27 DIAGNOSIS — M47816 Spondylosis without myelopathy or radiculopathy, lumbar region: Secondary | ICD-10-CM | POA: Diagnosis not present

## 2023-01-31 DIAGNOSIS — Z6837 Body mass index (BMI) 37.0-37.9, adult: Secondary | ICD-10-CM | POA: Diagnosis not present

## 2023-01-31 DIAGNOSIS — M48061 Spinal stenosis, lumbar region without neurogenic claudication: Secondary | ICD-10-CM | POA: Diagnosis not present

## 2023-02-27 DIAGNOSIS — R200 Anesthesia of skin: Secondary | ICD-10-CM | POA: Diagnosis not present

## 2023-04-03 DIAGNOSIS — R21 Rash and other nonspecific skin eruption: Secondary | ICD-10-CM | POA: Diagnosis not present

## 2023-04-03 DIAGNOSIS — M2559 Pain in other specified joint: Secondary | ICD-10-CM | POA: Diagnosis not present

## 2023-04-03 DIAGNOSIS — R6889 Other general symptoms and signs: Secondary | ICD-10-CM | POA: Diagnosis not present

## 2023-04-03 DIAGNOSIS — D582 Other hemoglobinopathies: Secondary | ICD-10-CM | POA: Diagnosis not present

## 2023-04-03 DIAGNOSIS — G6289 Other specified polyneuropathies: Secondary | ICD-10-CM | POA: Diagnosis not present

## 2023-04-03 DIAGNOSIS — M7989 Other specified soft tissue disorders: Secondary | ICD-10-CM | POA: Diagnosis not present

## 2023-04-08 DIAGNOSIS — N400 Enlarged prostate without lower urinary tract symptoms: Secondary | ICD-10-CM | POA: Diagnosis not present

## 2023-05-06 ENCOUNTER — Ambulatory Visit
Admission: EM | Admit: 2023-05-06 | Discharge: 2023-05-06 | Disposition: A | Discharge status: Home / Self Care | Payer: Federal, State, Local not specified - PPO

## 2023-05-06 ENCOUNTER — Encounter: Payer: Self-pay | Admitting: Emergency Medicine

## 2023-05-06 DIAGNOSIS — J22 Unspecified acute lower respiratory infection: Secondary | ICD-10-CM | POA: Diagnosis not present

## 2023-05-06 DIAGNOSIS — R059 Cough, unspecified: Secondary | ICD-10-CM | POA: Diagnosis not present

## 2023-05-06 MED ORDER — AMOXICILLIN-POT CLAVULANATE 875-125 MG PO TABS: 1.0000 | ORAL_TABLET | Freq: Two times a day (BID) | ORAL | 0 refills | Status: AC

## 2023-05-06 NOTE — ED Triage Notes (Signed)
 Chest congestion x 1.5 weeks.  Productive cough.  Has been taking mucinex and coricidin HBP without relief.

## 2023-05-06 NOTE — ED Provider Notes (Signed)
 [facility]    CSN: [id number] Arrival date & time: 05/06/23  0801      History   Chief Complaint No chief complaint on file.   HPI [name] is a 63 y.o. male.   The history is provided by the patient.   Patient presents for complaints of productive cough has been present for more than 1 week.  Patient states symptoms started as a head cold and now has moved to his chest.  Patient states that he is concerned he may develop pneumonia.  He denies fever, chills, wheezing, difficulty breathing, shortness of breath, abdominal pain, nausea, vomiting, diarrhea, or rash.  Patient states that he does have some soreness in his chest from the excessive coughing.  Reports he has been taking over-the-counter Coricidin and Mucinex for symptoms with minimal relief.  Denies history of asthma or smoking.  Past Medical History:  Diagnosis Date   CAD (coronary artery disease)    a. Coronary CT in 04/2021 showed < 25% plaque along the LAD and RCA with risk factor modification recommended.   Hypertension     There are no active problems to display for this patient.   Past Surgical History:  Procedure Laterality Date   CHOLECYSTECTOMY         Home Medications    Prior to Admission medications   Medication Sig Start Date End Date Taking? Authorizing Provider  tadalafil (CIALIS) 5 MG tablet Take 5 mg by mouth daily as needed for erectile dysfunction.   Yes [provider]  amLODipine  (NORVASC ) 10 MG tablet Take 1 tablet (10 mg total) by mouth daily. 09/06/22 12/05/22  Strader, Laymon HERO, PA-C  buPROPion (WELLBUTRIN XL) 300 MG 24 hr tablet Take 300 mg by mouth daily. 05/01/19   [provider]  COVID-19 mRNA vaccine, Moderna, (MODERNA COVID-19 VACCINE ) 100 MCG/0.5ML injection Inject into the muscle. 09/07/20   Luiz Channel, MD  Magnesium 500 MG CAPS Take by mouth.    [provider]  olmesartan (BENICAR) 40 MG tablet Take 40 mg by mouth daily.  08/23/22   [provider]  Omega-3 Fatty Acids (FISH OIL) 1000 MG CAPS Take 2,000 mg by mouth.    [provider]  VITAMIN D, ERGOCALCIFEROL, PO Take by mouth.    [provider]    Family History Family History  Problem Relation Age of Onset   Healthy Mother    Diabetes Father    Hypotension Father    Coronary artery disease Father     Social History Social History   Tobacco Use   Smoking status: Never   Smokeless tobacco: Never  Vaping Use   Vaping status: Never Used  Substance Use Topics   Alcohol use: Never   Drug use: Never     Allergies   Naprosyn [naproxen]   Review of Systems Review of Systems Per HPI  Physical Exam Triage Vital Signs ED Triage Vitals  Encounter Vitals Group     BP 05/06/23 0817 (!) 147/80     Systolic BP Percentile --      Diastolic BP Percentile --      Pulse Rate 05/06/23 0817 86     Resp 05/06/23 0817 18     Temp 05/06/23 0817 98.9 F (37.2 C)     Temp Source 05/06/23 0817 Oral     SpO2 05/06/23 0817 94 %     Weight --      Height --      Head Circumference --  Peak Flow --      Pain Score 05/06/23 0818 0     Pain Loc --      Pain Education --      Exclude from Growth Chart --    No data found.  Updated Vital Signs BP (!) 147/80 (BP Location: Right Arm)   Pulse 86   Temp 98.9 F (37.2 C) (Oral)   Resp 18   SpO2 94%   Visual Acuity Right Eye Distance:   Left Eye Distance:   Bilateral Distance:    Right Eye Near:   Left Eye Near:    Bilateral Near:     Physical Exam Vitals and nursing note reviewed.  Constitutional:      General: He is not in acute distress.    Appearance: Normal appearance.  HENT:     Head: Normocephalic.     Right Ear: Tympanic membrane, ear canal and external ear normal.     Left Ear: Tympanic membrane, ear canal and external ear normal.     Nose: Nose normal.     Mouth/Throat:     Lips: Pink.     Mouth: Mucous membranes are moist.     Pharynx:  Oropharynx is clear. Uvula midline. Postnasal drip present. No pharyngeal swelling, oropharyngeal exudate, posterior oropharyngeal erythema or uvula swelling.     Comments: Cobblestoning present to posterior oropharynx  Eyes:     Extraocular Movements: Extraocular movements intact.     Pupils: Pupils are equal, round, and reactive to light.  Cardiovascular:     Rate and Rhythm: Normal rate and regular rhythm.     Pulses: Normal pulses.     Heart sounds: Normal heart sounds.  Pulmonary:     Effort: Pulmonary effort is normal. No respiratory distress.     Breath sounds: Normal breath sounds. No stridor. No wheezing, rhonchi or rales.  Abdominal:     General: Bowel sounds are normal.     Palpations: Abdomen is soft.     Tenderness: There is no abdominal tenderness.  Musculoskeletal:     Cervical back: Normal range of motion.  Lymphadenopathy:     Cervical: No cervical adenopathy.  Skin:    General: Skin is warm and dry.  Neurological:     General: No focal deficit present.     Mental Status: He is alert and oriented to person, place, and time.  Psychiatric:        Mood and Affect: Mood normal.        Behavior: Behavior normal.      UC Treatments / Results  Labs (all labs ordered are listed, but only abnormal results are displayed) Labs Reviewed - No data to display  EKG   Radiology No results found.  Procedures Procedures (including critical care time)  Medications Ordered in UC Medications - No data to display  Initial Impression / Assessment and Plan / UC Course  I have reviewed the triage vital signs and the nursing notes.  Pertinent labs & imaging results that were available during my care of the patient were reviewed by me and considered in my medical decision making (see chart for details).  On exam, lung sounds are clear throughout, room air sats are at 94%.  Will cover patient for lower respiratory infection with Augmentin  875/125 mg tablets twice daily for  the next 5 days.  Patient advised to continue over-the-counter Mucinex and Coricidin for the cough.  Supportive care recommendations were provided and discussed with the patient to include  fluids, rest, over-the-counter Tylenol , and use of a humidifier at nighttime during sleep.  Discussed indications with the patient regarding follow-up.  Patient was in agreement with this plan of care and verbalized understanding.  All questions were answered.  Patient stable for discharge.   Final Clinical Impressions(s) / UC Diagnoses   Final diagnoses:  None   Discharge Instructions   None    ED Prescriptions   None    PDMP not reviewed this encounter.   Gilmer Etta PARAS, NP 05/06/23 [phone]

## 2023-05-06 NOTE — Discharge Instructions (Signed)
 Take medication as prescribed. Continue Coricidin HBP you are currently taking for your cough. Increase fluids and allow for plenty of rest. Recommend Tylenol  needed for pain, fever, or general discomfort. Recommend using a humidifier at bedtime during sleep  and sleeping slightly elevated while cough symptoms persist. Follow-up with your PCP or in this clinic if symptoms fail to improve.

## 2023-05-27 ENCOUNTER — Other Ambulatory Visit: Payer: Self-pay

## 2023-05-27 DIAGNOSIS — I712 Thoracic aortic aneurysm, without rupture, unspecified: Secondary | ICD-10-CM

## 2023-05-27 NOTE — Addendum Note (Signed)
Addended by: Roseanne Reno on: 05/27/2023 03:28 PM   Modules accepted: Orders

## 2023-07-30 DIAGNOSIS — U071 COVID-19: Secondary | ICD-10-CM | POA: Diagnosis not present

## 2023-09-17 ENCOUNTER — Telehealth: Payer: Self-pay | Admitting: *Deleted

## 2023-09-17 DIAGNOSIS — Z79899 Other long term (current) drug therapy: Secondary | ICD-10-CM

## 2023-09-17 NOTE — Telephone Encounter (Signed)
 Can add a CBC to BMET. Association is medication management.  Thanks, [place]    Orders placed.

## 2023-09-19 ENCOUNTER — Other Ambulatory Visit (HOSPITAL_COMMUNITY)
Admission: RE | Admit: 2023-09-19 | Discharge: 2023-09-19 | Disposition: A | Discharge status: Home / Self Care | Source: Ambulatory Visit | Attending: Student | Admitting: Student

## 2023-09-19 ENCOUNTER — Ambulatory Visit: Payer: Self-pay | Admitting: Student

## 2023-09-19 ENCOUNTER — Ambulatory Visit (HOSPITAL_BASED_OUTPATIENT_CLINIC_OR_DEPARTMENT_OTHER)
Admission: RE | Admit: 2023-09-19 | Discharge: 2023-09-19 | Disposition: A | Discharge status: Home / Self Care | Source: Ambulatory Visit | Attending: Student | Admitting: Student

## 2023-09-19 DIAGNOSIS — I712 Thoracic aortic aneurysm, without rupture, unspecified: Secondary | ICD-10-CM | POA: Insufficient documentation

## 2023-09-19 DIAGNOSIS — I77810 Thoracic aortic ectasia: Secondary | ICD-10-CM | POA: Diagnosis not present

## 2023-09-19 DIAGNOSIS — Z79899 Other long term (current) drug therapy: Secondary | ICD-10-CM | POA: Insufficient documentation

## 2023-09-19 DIAGNOSIS — E876 Hypokalemia: Secondary | ICD-10-CM

## 2023-09-19 LAB — BASIC METABOLIC PANEL WITH GFR
Anion gap: 9 (ref 5–15)
BUN: 8 mg/dL (ref 8–23)
CO2: 30 mmol/L (ref 22–32)
Calcium: 8.6 mg/dL — ABNORMAL LOW (ref 8.9–10.3)
Chloride: 104 mmol/L (ref 98–111)
Creatinine, Ser: 0.95 mg/dL (ref 0.61–1.24)
GFR, Estimated: >60 mL/min (ref >60)
Glucose, Bld: 104 mg/dL — ABNORMAL HIGH (ref 70–99)
Potassium: 3.3 mmol/L — ABNORMAL LOW (ref 3.5–5.1)
Sodium: 143 mmol/L (ref 135–145)

## 2023-09-19 LAB — CBC
HCT: 48.9 % (ref 39.0–52.0)
Hemoglobin: 16.7 g/dL (ref 13.0–17.0)
MCH: 28.9 pg (ref 26.0–34.0)
MCHC: 34.2 g/dL (ref 30.0–36.0)
MCV: 84.6 fL (ref 80.0–100.0)
Platelets: 331 10*3/uL (ref 150–400)
RBC: 5.78 MIL/uL (ref 4.22–5.81)
RDW: 13.9 % (ref 11.5–15.5)
WBC: 7.9 10*3/uL (ref 4.0–10.5)
nRBC: 0 % (ref 0.0–0.2)

## 2023-09-19 MED ORDER — IOHEXOL 350 MG/ML SOLN
75.0000 mL | Freq: Once | INTRAVENOUS | Status: AC | PRN
  Administered 2023-09-19: 75 mL via INTRAVENOUS

## 2023-09-19 MED ORDER — POTASSIUM CHLORIDE CRYS ER 20 MEQ PO TBCR: 20.0000 meq | EXTENDED_RELEASE_TABLET | Freq: Every day | ORAL | 2 refills | Status: DC

## 2023-09-19 NOTE — Telephone Encounter (Signed)
 Patient informed and says he takes OTC potassium supplement. Request to call office back with dose and directions because he was in the middle of renewing his passport.

## 2023-09-19 NOTE — Telephone Encounter (Signed)
-----   Message from Dorma Gash sent at 09/19/2023  1:09 PM EDT ----- Please let the patient know that his hemoglobin and platelets are within the normal range. Kidney function remains stable as well and can continue with plans for upcoming CT scan. His potassium is low at 3.3 and it appears this has been an issue in the past as well. Is he still taking K-dur? If not, would restart at 20 mEq daily. Repeat BMET in 7-10 days.

## 2023-09-20 ENCOUNTER — Ambulatory Visit: Payer: Self-pay | Admitting: Student

## 2023-09-26 DIAGNOSIS — E782 Mixed hyperlipidemia: Secondary | ICD-10-CM | POA: Diagnosis not present

## 2023-09-26 DIAGNOSIS — E7849 Other hyperlipidemia: Secondary | ICD-10-CM | POA: Diagnosis not present

## 2023-09-26 DIAGNOSIS — I10 Essential (primary) hypertension: Secondary | ICD-10-CM | POA: Diagnosis not present

## 2023-09-26 DIAGNOSIS — E6609 Other obesity due to excess calories: Secondary | ICD-10-CM | POA: Diagnosis not present

## 2023-09-26 DIAGNOSIS — Z6837 Body mass index (BMI) 37.0-37.9, adult: Secondary | ICD-10-CM | POA: Diagnosis not present

## 2023-10-06 NOTE — Progress Notes (Signed)
 Cardiology Office Note    Date:  10/08/2023  ID:  [name], DOB May 01, 1960, MRN [id number] Cardiologist: Armida Lander, MD    History of Present Illness:    [name] is a 63 y.o. male with past medical history of CAD (s/p Coronary CT 04/2021 showing minimal CAD along LAD and RCA), dilated ascending aorta (at 4.1 cm in 04/2021 and 4.2 cm in 06/2022), HTN, HLD and OSA (intolerant to CPAP) who presents to the office today for annual follow-up.  He was last examined by myself in 08/2022 and reported having a brief episode of numbness and tingling along his left arm and shoulder but symptoms resolved and he did not require transport upon EMS evaluation. He denied any specific anginal symptoms. BP was elevated above goal and Amlodipine  was increased to 10 mg daily. He was continued on Crestor  10 mg daily and Olmesartan 40 mg daily. It was recommended that we could consider the use of Chlorthalidone  or Coreg if needed in the future. Given his recent neurological issues, a Head CT was obtained which showed no acute intracranial abnormalities.  He did have a follow-up CT of his aorta last month and this showed his ascending thoracic aorta measured 4.4 cm. Was recommended to plan for annual imaging. Follow-up labs did show he was hypokalemic at 3.3 and he was started on K-dur 20 mEq daily with plans for a repeat BMET in 7-10 days (not yet obtained).   In talking with the patient today, he reports having one episode of chest pain approximately 2 to 3 weeks ago which occurred while sitting and on his phone and pain resolved with SL NTG x 2. He denies any recurrent episodes since. No recent exertional chest pain or dyspnea on exertion. No specific orthopnea, PND or pitting edema. He is no longer listed as being on statin therapy and says this was discontinued by his PCP for unclear reasons. He does report having painful urination and hesitancy for the past few days and this resembles his prior  UTI. Most recent UTI prior to this was 6+ months ago. He has reached out to his PCP but has not heard back in regards to medical therapy or an appointment for this.  Studies Reviewed:   EKG: EKG is ordered today and demonstrates:   EKG Interpretation Date/Time:  Wednesday October 08 2023 11:29:27 EDT Ventricular Rate:  77 PR Interval:  184 QRS Duration:  100 QT Interval:  402 QTC Calculation: 454 R Axis:   1  Text Interpretation: Normal sinus rhythm Normal ECG Confirmed by Woodfin Hays ([id number]) on 10/08/2023 11:33:47 AM       Cardiac CT: 04/2021 IMPRESSION: 1. Coronary calcium  score of 42.6. This was 52nd percentile for age-, race-, and sex-matched controls.   2. Normal coronary origin with right dominance.   3. There is minimal (<25%) plaque in the LAD and RCA.  CAD-RADS 1.   4.  Ascending aorta mildly dilated.  4.1 cm.   CTA Chest: 08/2023 IMPRESSION:   Similar dilatation of the ascending thoracic aorta measuring 4.4 cm.  Physical Exam:   VS:  BP (!) 138/90   Pulse 77   Ht 5' 10 (1.778 m)   Wt 250 lb 12.8 oz (113.8 kg)   SpO2 93%   BMI 35.99 kg/m    Wt Readings from Last 3 Encounters:  10/08/23 250 lb 12.8 oz (113.8 kg)  09/06/22 254 lb (115.2 kg)  09/19/21 251 lb (113.9 kg)  GEN: Well nourished, well developed male appearing in no acute distress NECK: No JVD; No carotid bruits CARDIAC: RRR, no murmurs, rubs, gallops RESPIRATORY:  Clear to auscultation without rales, wheezing or rhonchi  ABDOMEN: Appears non-distended. No obvious abdominal masses. EXTREMITIES: No clubbing or cyanosis. No pitting edema.  Distal pedal pulses are 2+ bilaterally.   Assessment and Plan:   1. Coronary artery disease involving native coronary artery of native heart without angina pectoris - Prior Coronary CTA in 2023 showed minimal CAD with less than 25% plaque along the LAD and RCA as outlined above. He reports one episode of chest pain as discussed above which is  overall atypical as it occurred at rest and no recurrence since. No recent exertional symptoms and EKG today is without acute ST changes. Given his recent reassuring workup and no recurrent symptoms, would not pursue repeat ischemic evaluation at this time. Continue with risk factor modification. Will plan to resume Crestor  10 mg daily as discussed below.  2. Essential hypertension - BP is at 138/90 during today's visit but he has been checking this at home and BP is overall well-controlled with SBP mostly in the 120's and diastolic readings in the 70's to 80's. Continue current medical therapy for now with Amlodipine  10 mg daily and Olmesartan 40 mg daily. He was hypokalemic by recent labs on 09/19/2023 and will recheck a BMET following initiation of potassium supplementation.   3. Hyperlipidemia LDL goal <70 - LDL was at 829 when checked by his PCP earlier this month. He has been off statin therapy for unclear reasons and will restart Crestor  10 mg daily. Recheck FLP and LFT's in 2 to 3 months for reassessment.  4. Aneurysm of ascending aorta without rupture (HCC) - This measured 4.4 cm by recent imaging. Would plan for follow-up imaging in 1 year for reassessment.  5. UTI - Symptoms are concerning for a UTI. Given his ascending aorta, would avoid fluoroquinolones. Will prescribe Bactrim and encouraged him to reach out to his PCP if symptoms do not improve as he may need a UA and culture.  Signed, Dorma Gash, PA-C

## 2023-10-07 ENCOUNTER — Ambulatory Visit (HOSPITAL_COMMUNITY)

## 2023-10-08 ENCOUNTER — Ambulatory Visit: Attending: Student | Admitting: Student

## 2023-10-08 ENCOUNTER — Encounter: Payer: Self-pay | Admitting: Student

## 2023-10-08 VITALS — BP 138/90 | HR 77 | Ht 70.0 in | Wt 250.8 lb

## 2023-10-08 DIAGNOSIS — I7121 Aneurysm of the ascending aorta, without rupture: Secondary | ICD-10-CM | POA: Diagnosis not present

## 2023-10-08 DIAGNOSIS — I10 Essential (primary) hypertension: Secondary | ICD-10-CM | POA: Diagnosis not present

## 2023-10-08 DIAGNOSIS — E785 Hyperlipidemia, unspecified: Secondary | ICD-10-CM

## 2023-10-08 DIAGNOSIS — N390 Urinary tract infection, site not specified: Secondary | ICD-10-CM

## 2023-10-08 DIAGNOSIS — I2510 Atherosclerotic heart disease of native coronary artery without angina pectoris: Secondary | ICD-10-CM | POA: Diagnosis not present

## 2023-10-08 MED ORDER — ROSUVASTATIN CALCIUM 10 MG PO TABS: 10.0000 mg | ORAL_TABLET | Freq: Every day | ORAL | 3 refills | Status: AC

## 2023-10-08 MED ORDER — SULFAMETHOXAZOLE-TRIMETHOPRIM 800-160 MG PO TABS: 1.0000 | ORAL_TABLET | Freq: Two times a day (BID) | ORAL | 0 refills | Status: AC

## 2023-10-08 NOTE — Patient Instructions (Addendum)
 Medication Instructions:  Your physician has recommended you make the following change in your medication:   -Restart Crestor  (Rosuvastatin ) 10 mg once daily  - Take Bactrim as prescribed for 10 days   *If you need a refill on your cardiac medications before your next appointment, please call your pharmacy*  Lab Work: Complete lab work next week   If you have labs (blood work) drawn today and your tests are completely normal, you will receive your results only by: MyChart Message (if you have MyChart) OR A paper copy in the mail If you have any lab test that is abnormal or we need to change your treatment, we will call you to review the results.  Testing/Procedures: None  Follow-Up: At [facility], you and your health needs are our priority.  As part of our continuing mission to provide you with exceptional heart care, our providers are all part of one team.  This team includes your primary Cardiologist (physician) and Advanced Practice Providers or APPs (Physician Assistants and Nurse Practitioners) who all work together to provide you with the care you need, when you need it.  Your next appointment:   1 year(s)  Provider:   You may see Armida Lander, MD or one of the following Advanced Practice Providers on your designated Care Team:   Woodfin Hays, PA-C  Scotesia McCune, [place] Theotis Flake, [place]     We recommend signing up for the patient portal called MyChart.  Sign up information is provided on this After Visit Summary.  MyChart is used to connect with patients for Virtual Visits (Telemedicine).  Patients are able to view lab/test results, encounter notes, upcoming appointments, etc.  Non-urgent messages can be sent to your provider as well.   To learn more about what you can do with MyChart, go to [email or web address].   Other Instructions

## 2024-01-20 ENCOUNTER — Other Ambulatory Visit: Payer: Self-pay | Admitting: Student

## 2024-03-14 ENCOUNTER — Telehealth: Payer: Self-pay

## 2024-03-14 ENCOUNTER — Ambulatory Visit
Admission: RE | Admit: 2024-03-14 | Discharge: 2024-03-14 | Disposition: A | Discharge status: Home / Self Care | Source: Ambulatory Visit | Attending: Nurse Practitioner | Admitting: Nurse Practitioner

## 2024-03-14 VITALS — BP 144/79 | HR 89 | Temp 99.4°F | Resp 18

## 2024-03-14 DIAGNOSIS — R682 Dry mouth, unspecified: Secondary | ICD-10-CM | POA: Diagnosis not present

## 2024-03-14 DIAGNOSIS — R350 Frequency of micturition: Secondary | ICD-10-CM | POA: Insufficient documentation

## 2024-03-14 DIAGNOSIS — N3000 Acute cystitis without hematuria: Secondary | ICD-10-CM | POA: Diagnosis not present

## 2024-03-14 LAB — POCT URINE DIPSTICK
Bilirubin, UA: NEGATIVE
Glucose, UA: NEGATIVE mg/dL
Ketones, POC UA: NEGATIVE mg/dL
Nitrite, UA: POSITIVE — AB
POC PROTEIN,UA: 100 — AB
Spec Grav, UA: 1.015 (ref 1.010–1.025)
Urobilinogen, UA: 1 U/dL
pH, UA: 6.5 (ref 5.0–8.0)

## 2024-03-14 LAB — GLUCOSE, POCT (MANUAL RESULT ENTRY): POC Glucose: 102 mg/dL — AB (ref 70–99)

## 2024-03-14 MED ORDER — CEPHALEXIN 500 MG PO CAPS: 500.0000 mg | ORAL_CAPSULE | Freq: Four times a day (QID) | ORAL | 0 refills | Status: DC

## 2024-03-14 NOTE — ED Triage Notes (Signed)
 Fever and chills since Wednesday.  Home covid and flu test on Thursday  and Saturday was negative.  States has had some discomfort in groin area and has been urinating frequently

## 2024-03-14 NOTE — ED Provider Notes (Signed)
 [facility]    CSN: [id number] Arrival date & time: 03/14/24  0857      History   Chief Complaint Chief Complaint  Patient presents with   Fever    Persistent fever. Have taken COVID and flu test twice in last couple days. Both negative. - Entered by patient    HPI [name] is a 63 y.o. male.   The history is provided by the patient.   Patient presents for complaints of fever, dry mouth, groin pain, and urinary frequency.  Patient states symptoms started approximately 4 to 5 days ago.  Tmax 100.7 with last fever occurring this morning.  Patient also endorses urinary frequency, but states that he has this at baseline, but has increased over the past several days.  He states that he has been drinking more water at night however.  Patient reports he has had a urinary tract infection in the past, but cannot recall when.  He denies headache, ear pain, nasal congestion, runny nose, cough, abdominal pain, flank pain, low back pain, hematuria, or penile discharge.  Patient states that he did take a home COVID and flu test on 2 occasions, both were negative.  Past Medical History:  Diagnosis Date   CAD (coronary artery disease)    a. Coronary CT in 04/2021 showed < 25% plaque along the LAD and RCA with risk factor modification recommended.   Hypertension     There are no active problems to display for this patient.   Past Surgical History:  Procedure Laterality Date   CHOLECYSTECTOMY         Home Medications    Prior to Admission medications   Medication Sig Start Date End Date Taking? Authorizing Provider  cephALEXin  (KEFLEX ) 500 MG capsule Take 1 capsule (500 mg total) by mouth 4 (four) times daily. 03/14/24  Yes Leath-Warren, Etta PARAS, NP  amLODipine  (NORVASC ) 10 MG tablet Take 1 tablet (10 mg total) by mouth daily. 09/06/22 10/08/23  Strader, Laymon HERO, PA-C  buPROPion (WELLBUTRIN XL) 300 MG 24 hr tablet Take 300 mg by mouth daily. 05/01/19   [provider]  Magnesium 500 MG CAPS Take by mouth.    [provider]  olmesartan (BENICAR) 40 MG tablet Take 40 mg by mouth daily. 08/23/22   [provider]  Omega-3 Fatty Acids (FISH OIL) 1000 MG CAPS Take 2,000 mg by mouth.    [provider]  potassium chloride  SA (KLOR-CON  M) 20 MEQ tablet Take 1 tablet (20 mEq total) by mouth daily. 01/22/24   Alvan Dorn FALCON, MD  rosuvastatin  (CRESTOR ) 10 MG tablet Take 1 tablet (10 mg total) by mouth daily. 10/08/23   Strader, Laymon HERO, PA-C  tadalafil (CIALIS) 5 MG tablet Take 5 mg by mouth daily as needed for erectile dysfunction.    [provider]  VITAMIN D, ERGOCALCIFEROL, PO Take by mouth.    [provider]    Family History Family History  Problem Relation Age of Onset   Healthy Mother    Diabetes Father    Hypotension Father    Coronary artery disease Father     Social History Social History   Tobacco Use   Smoking status: Never   Smokeless tobacco: Never  Vaping Use   Vaping status: Never Used  Substance Use Topics   Alcohol use: Never   Drug use: Never     Allergies   Naprosyn [naproxen]   Review of Systems Review of Systems Per HPI  Physical Exam Triage Vital Signs ED Triage Vitals  Encounter Vitals Group     BP 03/14/24 0919 (!) 144/79     Girls Systolic BP Percentile --      Girls Diastolic BP Percentile --      Boys Systolic BP Percentile --      Boys Diastolic BP Percentile --      Pulse Rate 03/14/24 0919 89     Resp 03/14/24 0919 18     Temp 03/14/24 0919 99.4 F (37.4 C)     Temp Source 03/14/24 0919 Oral     SpO2 03/14/24 0919 93 %     Weight --      Height --      Head Circumference --      Peak Flow --      Pain Score 03/14/24 0921 0     Pain Loc --      Pain Education --      Exclude from Growth Chart --    No data found.  Updated Vital Signs BP (!) 144/79 (BP Location: Right Arm)   Pulse 89   Temp 99.4 F (37.4 C) (Oral)   Resp 18    SpO2 93%   Visual Acuity Right Eye Distance:   Left Eye Distance:   Bilateral Distance:    Right Eye Near:   Left Eye Near:    Bilateral Near:     Physical Exam Vitals and nursing note reviewed.  Constitutional:      General: He is not in acute distress.    Appearance: Normal appearance.  HENT:     Head: Normocephalic.     Mouth/Throat:     Lips: Pink.     Mouth: Mucous membranes are dry.     Pharynx: Oropharynx is clear. Uvula midline.  Eyes:     Extraocular Movements: Extraocular movements intact.     Pupils: Pupils are equal, round, and reactive to light.  Cardiovascular:     Rate and Rhythm: Normal rate and regular rhythm.     Pulses: Normal pulses.     Heart sounds: Normal heart sounds.  Pulmonary:     Effort: Pulmonary effort is normal. No respiratory distress.     Breath sounds: Normal breath sounds. No stridor. No wheezing, rhonchi or rales.  Abdominal:     General: Bowel sounds are normal.     Palpations: Abdomen is soft.     Tenderness: There is no abdominal tenderness. There is no right CVA tenderness or left CVA tenderness.  Musculoskeletal:     Cervical back: Normal range of motion.  Skin:    General: Skin is warm and dry.  Neurological:     General: No focal deficit present.     Mental Status: He is alert and oriented to person, place, and time.  Psychiatric:        Mood and Affect: Mood normal.        Behavior: Behavior normal.      UC Treatments / Results  Labs (all labs ordered are listed, but only abnormal results are displayed) Labs Reviewed  POCT URINE DIPSTICK - Abnormal; Notable for the following components:      Result Value   Color, UA straw (*)    Clarity, UA cloudy (*)    Blood, UA moderate (*)    POC PROTEIN,UA =100 (*)    Nitrite, UA Positive (*)    Leukocytes, UA Large (3+) (*)    All other components within normal limits  GLUCOSE, POCT (MANUAL RESULT ENTRY) - Abnormal; Notable for the following components:   POC Glucose  102 (*)    All other components within normal limits  URINE CULTURE    EKG   Radiology No results found.  Procedures Procedures (including critical care time)  Medications Ordered in UC Medications - No data to display  Initial Impression / Assessment and Plan / UC Course  I have reviewed the triage vital signs and the nursing notes.  Pertinent labs & imaging results that were available during my care of the patient were reviewed by me and considered in my medical decision making (see chart for details).  Patient presents for complaints of fever, groin pain, and urinary frequency for the past several days.  On exam, he is well-appearing, is in no acute distress, vital signs are stable.  He does not exhibit any CVA tenderness or suprapubic tenderness.  He does report that his mouth has been dry.  CBG performed: 102  Urinalysis is positive for nitrites, leukocytes, protein, and blood, consistent with acute cystitis.  Will treat with Keflex  500 mg 4 times daily for the next 7 days.  Urine culture is pending.  Discussion with patient that it is recommended that he follow-up with his PCP for reevaluation and to determine whether or not referral to urology is necessary given his prior history of UTI.  Supportive care recommendations were provided and discussed with the patient to include fluids, rest, over-the-counter Tylenol , developing a toileting schedule, avoiding caffeine, and to monitor for worsening symptoms.  Patient was given strict ER follow-up precautions.  Patient was in agreement with this plan of care and verbalizes understanding.  All questions were answered.  Patient stable for discharge.   Final Clinical Impressions(s) / UC Diagnoses   Final diagnoses:  Urinary frequency  Dry mouth  Acute cystitis without hematuria     Discharge Instructions      A urine culture is pending.  You will be contacted when the results of the urine culture are received. Your blood sugar  today was 102.  If you continue to experience dry mouth, or if your symptoms worsen, please follow-up with your primary care physician for further evaluation. Take medication as prescribed. You may take over-the-counter Tylenol  as needed for pain, fever, or general discomfort. Increase your fluid intake.  Try to drink at least 8-10 8 ounce glasses of water daily while symptoms persist. Develop a toileting schedule that will allow you to urinate at least every 2 hours. Avoid caffeine such as tea, soda, or coffee while symptoms persist. Go to the emergency department if you develop worsening urinary symptoms, worsening fever, pain around your kidneys, or other concerns. As discussed, please follow-up with your primary care physician within the next 7 to 10 days for reevaluation and to discuss whether or not referral to urology is recommended. Follow-up as needed.      ED Prescriptions     Medication Sig Dispense Auth. Provider   cephALEXin  (KEFLEX ) 500 MG capsule Take 1 capsule (500 mg total) by mouth 4 (four) times daily. 28 capsule Leath-Warren, Etta PARAS, NP      PDMP not reviewed this encounter.   Gilmer Etta PARAS, NP 03/14/24 [phone]

## 2024-03-14 NOTE — Discharge Instructions (Addendum)
 A urine culture is pending.  You will be contacted when the results of the urine culture are received. Your blood sugar today was 102.  If you continue to experience dry mouth, or if your symptoms worsen, please follow-up with your primary care physician for further evaluation. Take medication as prescribed. You may take over-the-counter Tylenol  as needed for pain, fever, or general discomfort. Increase your fluid intake.  Try to drink at least 8-10 8 ounce glasses of water daily while symptoms persist. Develop a toileting schedule that will allow you to urinate at least every 2 hours. Avoid caffeine such as tea, soda, or coffee while symptoms persist. Go to the emergency department if you develop worsening urinary symptoms, worsening fever, pain around your kidneys, or other concerns. As discussed, please follow-up with your primary care physician within the next 7 to 10 days for reevaluation and to discuss whether or not referral to urology is recommended. Follow-up as needed.

## 2024-03-16 ENCOUNTER — Ambulatory Visit (HOSPITAL_COMMUNITY): Payer: Self-pay

## 2024-03-16 LAB — URINE CULTURE: Culture: >=100000 — AB

## 2024-04-16 ENCOUNTER — Ambulatory Visit
Admission: RE | Admit: 2024-04-16 | Discharge: 2024-04-16 | Disposition: A | Discharge status: Home / Self Care | Source: Ambulatory Visit | Attending: Family Medicine | Admitting: Family Medicine

## 2024-04-16 ENCOUNTER — Ambulatory Visit

## 2024-04-16 VITALS — BP 130/75 | HR 89 | Temp 98.0°F | Resp 18

## 2024-04-16 DIAGNOSIS — S92424A Nondisplaced fracture of distal phalanx of right great toe, initial encounter for closed fracture: Secondary | ICD-10-CM

## 2024-04-16 NOTE — ED Triage Notes (Signed)
 Hit right great toe on a table leg yesterday.  Toe swollen and bruised.

## 2024-04-16 NOTE — Discharge Instructions (Signed)
 We have placed you in a postop shoe today to help stabilize the area of the fracture and the tip of the great toe.  Ice, elevate, over-the-counter pain relievers as needed.  Follow-up with podiatry for recheck in the next week or so.

## 2024-04-16 NOTE — ED Provider Notes (Signed)
 " [facility]    CSN: [id number] Arrival date & time: 04/16/24  1148      History   Chief Complaint No chief complaint on file.   HPI [name] is a 63 y.o. male.   Patient presenting today with pain, swelling, bruising to the tip of the right great toe after hitting it on a table leg yesterday.  He denies complete loss of range of motion in the toe, numbness or tingling beyond baseline neuropathy, skin injury, bleeding.  So far trying over-the-counter remedies with minimal relief.    Past Medical History:  Diagnosis Date   CAD (coronary artery disease)    a. Coronary CT in 04/2021 showed < 25% plaque along the LAD and RCA with risk factor modification recommended.   Hypertension     There are no active problems to display for this patient.   Past Surgical History:  Procedure Laterality Date   CHOLECYSTECTOMY         Home Medications    Prior to Admission medications  Medication Sig Start Date End Date Taking? Authorizing Provider  amLODipine  (NORVASC ) 10 MG tablet Take 1 tablet (10 mg total) by mouth daily. 09/06/22 10/08/23  Strader, Laymon HERO, PA-C  buPROPion (WELLBUTRIN XL) 300 MG 24 hr tablet Take 300 mg by mouth daily. 05/01/19   [provider]  Magnesium 500 MG CAPS Take by mouth.    [provider]  olmesartan (BENICAR) 40 MG tablet Take 40 mg by mouth daily. 08/23/22   [provider]  Omega-3 Fatty Acids (FISH OIL) 1000 MG CAPS Take 2,000 mg by mouth.    [provider]  potassium chloride  SA (KLOR-CON  M) 20 MEQ tablet Take 1 tablet (20 mEq total) by mouth daily. 01/22/24   Alvan Dorn FALCON, MD  rosuvastatin  (CRESTOR ) 10 MG tablet Take 1 tablet (10 mg total) by mouth daily. 10/08/23   Strader, Laymon HERO, PA-C  tadalafil (CIALIS) 5 MG tablet Take 5 mg by mouth daily as needed for erectile dysfunction.    [provider]  VITAMIN D, ERGOCALCIFEROL, PO Take by mouth.    [provider]     Family History Family History  Problem Relation Age of Onset   Healthy Mother    Diabetes Father    Hypotension Father    Coronary artery disease Father     Social History Social History[1]   Allergies   Naprosyn [naproxen]   Review of Systems Review of Systems PER HPI  Physical Exam Triage Vital Signs ED Triage Vitals  Encounter Vitals Group     BP 04/16/24 1209 130/75     Girls Systolic BP Percentile --      Girls Diastolic BP Percentile --      Boys Systolic BP Percentile --      Boys Diastolic BP Percentile --      Pulse Rate 04/16/24 1209 89     Resp 04/16/24 1209 18     Temp 04/16/24 1209 98 F (36.7 C)     Temp Source 04/16/24 1209 Oral     SpO2 04/16/24 1209 92 %     Weight --      Height --      Head Circumference --      Peak Flow --      Pain Score 04/16/24 1210 0     Pain Loc --      Pain Education --      Exclude from Growth Chart --  No data found.  Updated Vital Signs BP 130/75 (BP Location: Right Arm)   Pulse 89   Temp 98 F (36.7 C) (Oral)   Resp 18   SpO2 92%   Visual Acuity Right Eye Distance:   Left Eye Distance:   Bilateral Distance:    Right Eye Near:   Left Eye Near:    Bilateral Near:     Physical Exam Vitals and nursing note reviewed.  Constitutional:      Appearance: Normal appearance.  HENT:     Head: Atraumatic.     Mouth/Throat:     Mouth: Mucous membranes are moist.  Eyes:     Extraocular Movements: Extraocular movements intact.     Conjunctiva/sclera: Conjunctivae normal.  Cardiovascular:     Rate and Rhythm: Normal rate.  Pulmonary:     Effort: Pulmonary effort is normal.  Musculoskeletal:        General: Swelling, tenderness and signs of injury present. No deformity. Normal range of motion.     Cervical back: Normal range of motion and neck supple.     Comments: Significant tenderness to palpation, bruising and edema to the distal right great toe  Skin:    General: Skin is warm and dry.      Findings: Bruising and erythema present.  Neurological:     Mental Status: He is oriented to person, place, and time.     Comments: Right lower extremity neurovascularly intact  Psychiatric:        Mood and Affect: Mood normal.        Thought Content: Thought content normal.        Judgment: Judgment normal.      UC Treatments / Results  Labs (all labs ordered are listed, but only abnormal results are displayed) Labs Reviewed - No data to display  EKG   Radiology DG Toe Great Right Result Date: 04/16/2024 EXAM: VIEW(S) XRAY OF THE TOES 04/16/2024 12:19:53 PM COMPARISON: None available. CLINICAL HISTORY: hit toe on table leg, bruised and swollen FINDINGS: BONES AND JOINTS: Oblique fracture of distal phalanx of first digit with intra-articular extension. Degenerative changes of first MTP joint. SOFT TISSUES: Soft tissue swelling. IMPRESSION: 1. Oblique fracture of distal phalanx of first digit with intra-articular extension. 2. Soft tissue swelling. Electronically signed by: Donnice Mania MD 04/16/2024 12:53 PM EST RP Workstation: [id number]    Procedures Procedures (including critical care time)  Medications Ordered in UC Medications - No data to display  Initial Impression / Assessment and Plan / UC Course  I have reviewed the triage vital signs and the nursing notes.  Pertinent labs & imaging results that were available during my care of the patient were reviewed by me and considered in my medical decision making (see chart for details).     X-ray of the right foot showing a fracture of the distal phalanx of the right great toe.  Placed in a postop shoe, discussed RICE, podiatry follow-up, over-the-counter pain relievers.  Return for worsening or unresolving symptoms.  Final Clinical Impressions(s) / UC Diagnoses   Final diagnoses:  Closed nondisplaced fracture of distal phalanx of right great toe, initial encounter     Discharge Instructions      We have placed you  in a postop shoe today to help stabilize the area of the fracture and the tip of the great toe.  Ice, elevate, over-the-counter pain relievers as needed.  Follow-up with podiatry for recheck in the next week or so.  ED Prescriptions   None    PDMP not reviewed this encounter.    [1]  Social History Tobacco Use   Smoking status: Never   Smokeless tobacco: Never  Vaping Use   Vaping status: Never Used  Substance Use Topics   Alcohol use: Never   Drug use: Never     Stuart Vernell Norris, PA-C 04/16/24 1330  "

## 2024-04-17 ENCOUNTER — Ambulatory Visit
